# Patient Record
Sex: Female | Born: 1960
Health system: Southern US, Community
[De-identification: ages and names within clinical notes are randomized; demographics above are authoritative.]

## PROBLEM LIST (undated history)

## (undated) DIAGNOSIS — Z87442 Personal history of urinary calculi: Secondary | ICD-10-CM

## (undated) DIAGNOSIS — Z973 Presence of spectacles and contact lenses: Secondary | ICD-10-CM

## (undated) DIAGNOSIS — D649 Anemia, unspecified: Secondary | ICD-10-CM

## (undated) DIAGNOSIS — F418 Other specified anxiety disorders: Secondary | ICD-10-CM

## (undated) HISTORY — PX: TUBAL LIGATION: SHX77

## (undated) HISTORY — DX: Other specified anxiety disorders: F41.8

## (undated) HISTORY — PX: HERNIA REPAIR: SHX51

## (undated) HISTORY — DX: Anemia, unspecified: D64.9

---

## 2001-10-19 ENCOUNTER — Encounter (INDEPENDENT_AMBULATORY_CARE_PROVIDER_SITE_OTHER): Payer: Self-pay | Admitting: *Deleted

## 2001-10-19 ENCOUNTER — Ambulatory Visit (HOSPITAL_BASED_OUTPATIENT_CLINIC_OR_DEPARTMENT_OTHER): Admission: RE | Admit: 2001-10-19 | Discharge: 2001-10-19 | Payer: Self-pay | Admitting: Specialist

## 2003-03-02 ENCOUNTER — Other Ambulatory Visit: Admission: RE | Admit: 2003-03-02 | Discharge: 2003-03-02 | Payer: Self-pay | Admitting: *Deleted

## 2003-06-02 ENCOUNTER — Ambulatory Visit (HOSPITAL_COMMUNITY): Admission: RE | Admit: 2003-06-02 | Discharge: 2003-06-02 | Payer: Self-pay | Admitting: *Deleted

## 2003-06-02 ENCOUNTER — Encounter (INDEPENDENT_AMBULATORY_CARE_PROVIDER_SITE_OTHER): Payer: Self-pay

## 2004-06-29 ENCOUNTER — Ambulatory Visit: Payer: Self-pay | Admitting: Internal Medicine

## 2004-07-27 ENCOUNTER — Ambulatory Visit: Payer: Self-pay | Admitting: Internal Medicine

## 2004-11-06 ENCOUNTER — Ambulatory Visit: Payer: Self-pay | Admitting: Internal Medicine

## 2005-01-23 ENCOUNTER — Ambulatory Visit: Payer: Self-pay | Admitting: Internal Medicine

## 2006-02-10 ENCOUNTER — Ambulatory Visit: Payer: Self-pay | Admitting: Internal Medicine

## 2006-02-10 ENCOUNTER — Encounter (INDEPENDENT_AMBULATORY_CARE_PROVIDER_SITE_OTHER): Payer: Self-pay | Admitting: Specialist

## 2006-02-10 ENCOUNTER — Other Ambulatory Visit: Admission: RE | Admit: 2006-02-10 | Discharge: 2006-02-10 | Payer: Self-pay | Admitting: Internal Medicine

## 2006-08-19 DIAGNOSIS — F418 Other specified anxiety disorders: Secondary | ICD-10-CM

## 2006-08-19 HISTORY — DX: Other specified anxiety disorders: F41.8

## 2007-03-02 ENCOUNTER — Ambulatory Visit: Payer: Self-pay | Admitting: Internal Medicine

## 2007-03-02 DIAGNOSIS — F411 Generalized anxiety disorder: Secondary | ICD-10-CM | POA: Insufficient documentation

## 2007-03-02 DIAGNOSIS — D649 Anemia, unspecified: Secondary | ICD-10-CM | POA: Insufficient documentation

## 2007-03-02 DIAGNOSIS — R87619 Unspecified abnormal cytological findings in specimens from cervix uteri: Secondary | ICD-10-CM

## 2007-03-02 DIAGNOSIS — F329 Major depressive disorder, single episode, unspecified: Secondary | ICD-10-CM

## 2007-03-04 LAB — CONVERTED CEMR LAB
Basophils Relative: 1.2 % — ABNORMAL HIGH (ref 0.0–1.0)
CO2: 29 meq/L (ref 19–32)
Cholesterol: 233 mg/dL (ref 0–200)
Eosinophils Absolute: 0 10*3/uL (ref 0.0–0.6)
Eosinophils Relative: 0.9 % (ref 0.0–5.0)
GFR calc Af Amer: 100 mL/min
GFR calc non Af Amer: 82 mL/min
Glucose, Bld: 83 mg/dL (ref 70–99)
HDL: 73.1 mg/dL (ref >39.0)
Hemoglobin: 11.8 g/dL — ABNORMAL LOW (ref 12.0–15.0)
Lymphocytes Relative: 41.4 % (ref 12.0–46.0)
MCV: 91.5 fL (ref 78.0–100.0)
Monocytes Absolute: 0.5 10*3/uL (ref 0.2–0.7)
Monocytes Relative: 13.6 % — ABNORMAL HIGH (ref 3.0–11.0)
Neutro Abs: 1.6 10*3/uL (ref 1.4–7.7)
Platelets: 212 10*3/uL (ref 150–400)
Potassium: 4.1 meq/L (ref 3.5–5.1)
VLDL: 31 mg/dL (ref 0–40)
WBC: 3.8 10*3/uL — ABNORMAL LOW (ref 4.5–10.5)

## 2007-04-17 ENCOUNTER — Telehealth (INDEPENDENT_AMBULATORY_CARE_PROVIDER_SITE_OTHER): Payer: Self-pay | Admitting: *Deleted

## 2007-05-08 ENCOUNTER — Ambulatory Visit: Payer: Self-pay | Admitting: Gastroenterology

## 2007-06-16 ENCOUNTER — Telehealth: Payer: Self-pay | Admitting: Internal Medicine

## 2007-07-06 ENCOUNTER — Encounter (INDEPENDENT_AMBULATORY_CARE_PROVIDER_SITE_OTHER): Payer: Self-pay | Admitting: *Deleted

## 2007-07-07 ENCOUNTER — Ambulatory Visit: Payer: Self-pay | Admitting: Gastroenterology

## 2007-07-07 ENCOUNTER — Encounter: Payer: Self-pay | Admitting: Internal Medicine

## 2007-07-07 ENCOUNTER — Encounter: Payer: Self-pay | Admitting: Gastroenterology

## 2007-07-07 DIAGNOSIS — K257 Chronic gastric ulcer without hemorrhage or perforation: Secondary | ICD-10-CM | POA: Insufficient documentation

## 2007-08-24 ENCOUNTER — Ambulatory Visit: Payer: Self-pay | Admitting: Internal Medicine

## 2007-09-02 ENCOUNTER — Telehealth: Payer: Self-pay | Admitting: Internal Medicine

## 2007-11-23 ENCOUNTER — Telehealth (INDEPENDENT_AMBULATORY_CARE_PROVIDER_SITE_OTHER): Payer: Self-pay | Admitting: *Deleted

## 2008-02-23 ENCOUNTER — Ambulatory Visit: Payer: Self-pay | Admitting: Internal Medicine

## 2008-02-25 ENCOUNTER — Encounter (INDEPENDENT_AMBULATORY_CARE_PROVIDER_SITE_OTHER): Payer: Self-pay | Admitting: *Deleted

## 2008-02-29 ENCOUNTER — Encounter: Payer: Self-pay | Admitting: Gastroenterology

## 2008-03-01 ENCOUNTER — Encounter: Payer: Self-pay | Admitting: Internal Medicine

## 2008-03-02 ENCOUNTER — Telehealth: Payer: Self-pay | Admitting: Internal Medicine

## 2008-03-06 ENCOUNTER — Telehealth: Payer: Self-pay | Admitting: Internal Medicine

## 2008-04-05 ENCOUNTER — Ambulatory Visit: Payer: Self-pay | Admitting: Internal Medicine

## 2008-04-05 LAB — CONVERTED CEMR LAB
Inflenza A Ag: NEGATIVE
Influenza B Ag: NEGATIVE

## 2008-04-07 ENCOUNTER — Telehealth: Payer: Self-pay | Admitting: Internal Medicine

## 2008-04-14 ENCOUNTER — Encounter: Payer: Self-pay | Admitting: Internal Medicine

## 2008-05-06 ENCOUNTER — Telehealth (INDEPENDENT_AMBULATORY_CARE_PROVIDER_SITE_OTHER): Payer: Self-pay | Admitting: *Deleted

## 2008-06-23 ENCOUNTER — Ambulatory Visit: Payer: Self-pay | Admitting: Internal Medicine

## 2008-06-27 ENCOUNTER — Telehealth (INDEPENDENT_AMBULATORY_CARE_PROVIDER_SITE_OTHER): Payer: Self-pay | Admitting: *Deleted

## 2008-06-27 LAB — CONVERTED CEMR LAB
BUN: 12 mg/dL (ref 6–23)
Basophils Absolute: 0 10*3/uL (ref 0.0–0.1)
Cholesterol: 187 mg/dL (ref 0–200)
Eosinophils Absolute: 0.1 10*3/uL (ref 0.0–0.7)
Eosinophils Relative: 1.7 % (ref 0.0–5.0)
GFR calc Af Amer: 116 mL/min
GFR calc non Af Amer: 96 mL/min
HCT: 33.6 % — ABNORMAL LOW (ref 36.0–46.0)
HDL: 51 mg/dL (ref >39.0)
MCHC: 33.6 g/dL (ref 30.0–36.0)
MCV: 92.7 fL (ref 78.0–100.0)
Monocytes Absolute: 0.4 10*3/uL (ref 0.1–1.0)
Platelets: 200 10*3/uL (ref 150–400)
Potassium: 4.1 meq/L (ref 3.5–5.1)
RDW: 12.9 % (ref 11.5–14.6)
TSH: 1.07 microintl units/mL (ref 0.35–5.50)
Triglycerides: 67 mg/dL (ref 0–149)
VLDL: 13 mg/dL (ref 0–40)

## 2008-06-28 ENCOUNTER — Ambulatory Visit: Payer: Self-pay | Admitting: Internal Medicine

## 2008-07-01 ENCOUNTER — Telehealth (INDEPENDENT_AMBULATORY_CARE_PROVIDER_SITE_OTHER): Payer: Self-pay | Admitting: *Deleted

## 2008-07-01 LAB — CONVERTED CEMR LAB: Folate: 11.1 ng/mL

## 2008-07-07 ENCOUNTER — Encounter (INDEPENDENT_AMBULATORY_CARE_PROVIDER_SITE_OTHER): Payer: Self-pay | Admitting: *Deleted

## 2008-09-29 ENCOUNTER — Ambulatory Visit: Payer: Self-pay | Admitting: Internal Medicine

## 2008-09-30 ENCOUNTER — Encounter (INDEPENDENT_AMBULATORY_CARE_PROVIDER_SITE_OTHER): Payer: Self-pay | Admitting: *Deleted

## 2008-10-05 ENCOUNTER — Ambulatory Visit: Payer: Self-pay | Admitting: Gastroenterology

## 2008-10-05 DIAGNOSIS — R1310 Dysphagia, unspecified: Secondary | ICD-10-CM | POA: Insufficient documentation

## 2008-10-12 ENCOUNTER — Telehealth (INDEPENDENT_AMBULATORY_CARE_PROVIDER_SITE_OTHER): Payer: Self-pay | Admitting: *Deleted

## 2008-10-19 ENCOUNTER — Telehealth: Payer: Self-pay | Admitting: Gastroenterology

## 2008-10-20 ENCOUNTER — Ambulatory Visit: Payer: Self-pay | Admitting: Gastroenterology

## 2008-10-24 ENCOUNTER — Encounter: Payer: Self-pay | Admitting: Gastroenterology

## 2008-11-09 ENCOUNTER — Telehealth (INDEPENDENT_AMBULATORY_CARE_PROVIDER_SITE_OTHER): Payer: Self-pay | Admitting: *Deleted

## 2009-02-10 ENCOUNTER — Telehealth (INDEPENDENT_AMBULATORY_CARE_PROVIDER_SITE_OTHER): Payer: Self-pay | Admitting: *Deleted

## 2009-03-15 ENCOUNTER — Telehealth (INDEPENDENT_AMBULATORY_CARE_PROVIDER_SITE_OTHER): Payer: Self-pay | Admitting: *Deleted

## 2009-03-21 ENCOUNTER — Telehealth (INDEPENDENT_AMBULATORY_CARE_PROVIDER_SITE_OTHER): Payer: Self-pay | Admitting: *Deleted

## 2009-03-23 ENCOUNTER — Telehealth: Payer: Self-pay | Admitting: Internal Medicine

## 2009-03-28 ENCOUNTER — Ambulatory Visit: Payer: Self-pay | Admitting: Internal Medicine

## 2009-04-10 ENCOUNTER — Telehealth: Payer: Self-pay | Admitting: Internal Medicine

## 2009-05-03 ENCOUNTER — Telehealth: Payer: Self-pay | Admitting: Internal Medicine

## 2009-06-06 ENCOUNTER — Encounter (INDEPENDENT_AMBULATORY_CARE_PROVIDER_SITE_OTHER): Payer: Self-pay | Admitting: *Deleted

## 2009-06-06 ENCOUNTER — Telehealth (INDEPENDENT_AMBULATORY_CARE_PROVIDER_SITE_OTHER): Payer: Self-pay | Admitting: *Deleted

## 2009-06-15 ENCOUNTER — Ambulatory Visit (HOSPITAL_BASED_OUTPATIENT_CLINIC_OR_DEPARTMENT_OTHER): Admission: RE | Admit: 2009-06-15 | Discharge: 2009-06-15 | Payer: Self-pay | Admitting: Internal Medicine

## 2009-06-15 ENCOUNTER — Ambulatory Visit: Payer: Self-pay | Admitting: Diagnostic Radiology

## 2009-07-21 ENCOUNTER — Ambulatory Visit: Payer: Self-pay | Admitting: Family Medicine

## 2009-08-07 ENCOUNTER — Telehealth: Payer: Self-pay | Admitting: Internal Medicine

## 2009-09-08 ENCOUNTER — Telehealth: Payer: Self-pay | Admitting: Internal Medicine

## 2009-11-14 ENCOUNTER — Ambulatory Visit: Payer: Self-pay | Admitting: Internal Medicine

## 2009-11-23 LAB — CONVERTED CEMR LAB
BUN: 11 mg/dL (ref 6–23)
Basophils Absolute: 0.1 10*3/uL (ref 0.0–0.1)
Chloride: 107 meq/L (ref 96–112)
Cholesterol: 212 mg/dL — ABNORMAL HIGH (ref 0–200)
GFR calc non Af Amer: 81.29 mL/min (ref >60)
Glucose, Bld: 78 mg/dL (ref 70–99)
HCT: 38.1 % (ref 36.0–46.0)
HDL: 69.9 mg/dL (ref >39.00)
Lymphs Abs: 1.7 10*3/uL (ref 0.7–4.0)
MCV: 99.7 fL (ref 78.0–100.0)
Monocytes Absolute: 1.3 10*3/uL — ABNORMAL HIGH (ref 0.1–1.0)
Platelets: 196 10*3/uL (ref 150.0–400.0)
Potassium: 3.8 meq/L (ref 3.5–5.1)
RDW: 12.3 % (ref 11.5–14.6)
Total Bilirubin: 0.6 mg/dL (ref 0.3–1.2)
Total CHOL/HDL Ratio: 3
Triglycerides: 90 mg/dL (ref 0.0–149.0)
VLDL: 18 mg/dL (ref 0.0–40.0)

## 2009-12-05 ENCOUNTER — Telehealth (INDEPENDENT_AMBULATORY_CARE_PROVIDER_SITE_OTHER): Payer: Self-pay | Admitting: *Deleted

## 2010-01-12 ENCOUNTER — Telehealth (INDEPENDENT_AMBULATORY_CARE_PROVIDER_SITE_OTHER): Payer: Self-pay | Admitting: *Deleted

## 2010-04-04 ENCOUNTER — Telehealth: Payer: Self-pay | Admitting: Internal Medicine

## 2010-06-25 ENCOUNTER — Telehealth: Payer: Self-pay | Admitting: Internal Medicine

## 2010-06-29 ENCOUNTER — Ambulatory Visit: Payer: Self-pay | Admitting: Internal Medicine

## 2010-06-29 DIAGNOSIS — R635 Abnormal weight gain: Secondary | ICD-10-CM | POA: Insufficient documentation

## 2010-06-29 DIAGNOSIS — K3189 Other diseases of stomach and duodenum: Secondary | ICD-10-CM

## 2010-06-29 DIAGNOSIS — R1013 Epigastric pain: Secondary | ICD-10-CM

## 2010-07-04 LAB — CONVERTED CEMR LAB
AST: 20 units/L (ref 0–37)
Basophils Relative: 0.5 % (ref 0.0–3.0)
Bilirubin, Direct: 0.2 mg/dL (ref 0.0–0.3)
Eosinophils Relative: 0.7 % (ref 0.0–5.0)
HCT: 35.4 % — ABNORMAL LOW (ref 36.0–46.0)
Lymphs Abs: 1.4 10*3/uL (ref 0.7–4.0)
MCHC: 35 g/dL (ref 30.0–36.0)
MCV: 98 fL (ref 78.0–100.0)
Monocytes Absolute: 0.6 10*3/uL (ref 0.1–1.0)
RBC: 3.61 M/uL — ABNORMAL LOW (ref 3.87–5.11)
Total Bilirubin: 0.8 mg/dL (ref 0.3–1.2)
WBC: 4.8 10*3/uL (ref 4.5–10.5)

## 2010-07-10 ENCOUNTER — Encounter (INDEPENDENT_AMBULATORY_CARE_PROVIDER_SITE_OTHER): Payer: Self-pay | Admitting: *Deleted

## 2010-07-13 ENCOUNTER — Ambulatory Visit: Payer: Self-pay | Admitting: Internal Medicine

## 2010-08-28 ENCOUNTER — Telehealth: Payer: Self-pay | Admitting: Internal Medicine

## 2010-08-29 ENCOUNTER — Ambulatory Visit
Admission: RE | Admit: 2010-08-29 | Discharge: 2010-08-29 | Payer: Self-pay | Source: Home / Self Care | Attending: Internal Medicine | Admitting: Internal Medicine

## 2010-09-05 ENCOUNTER — Telehealth: Payer: Self-pay | Admitting: Internal Medicine

## 2010-09-08 ENCOUNTER — Encounter: Payer: Self-pay | Admitting: Internal Medicine

## 2010-09-09 ENCOUNTER — Encounter: Payer: Self-pay | Admitting: Internal Medicine

## 2010-09-18 NOTE — Progress Notes (Signed)
Summary: REFILL REQUEST  Phone Note Refill Request Call back at [phone] Message from:  Pharmacy on April 04, 2010 9:33 AM  Refills Requested: Medication #1:  ALPRAZOLAM 0.5 MG TABS 3 by mouth qhs   Dosage confirmed as above?Dosage Confirmed   Supply Requested: 1 month   Last Refilled: 03/09/2010 [facility] [address]  Next Appointment Scheduled: NONE Initial call taken by: Lavell Islam,  April 04, 2010 9:33 AM  Follow-up for Phone Call        ok 90 and 6 RF  Follow-up by: [facility] E. Bayle Calvo MD,  April 04, 2010 4:51 PM    Prescriptions: ALPRAZOLAM 0.5 MG TABS (ALPRAZOLAM) 3 by mouth qhs  #90 x 6   Entered by:   Army Fossa CMA   Authorized by:   Nolon Rod. Janetta Vandoren MD   Signed by:   Army Fossa CMA on 04/04/2010   Method used:   Printed then faxed to ...       [facility] [address] (retail)       [address]       [place], [place]  [address]       Ph: [phone] or [phone]       Fax: [phone]   RxID:   [id number]

## 2010-09-18 NOTE — Assessment & Plan Note (Signed)
Summary: Discuss abd being swollen/drb   Vital Signs:  Patient profile:   50 year old female Height:      66 inches (167.64 cm) Weight:      161.50 pounds (73.41 kg) BMI:     26.16 Temp:     97.8 degrees F (36.56 degrees C) oral BP sitting:   140 / 80  (left arm) Cuff size:   regular  Vitals Entered By: Lucious Groves CMA (June 29, 2010 1:02 PM) CC: Discuss abd being swollen on/off x1 week./kb Is Patient Diabetic? No Pain Assessment Patient in pain? yes     Location: abdomen Intensity: 6 Type: tightness Onset of pain  one week   History of Present Illness: several concerns Feels like her whole abdomen is bloated on an off, symptoms not necessarily related to food. Has a lot of gas. Her weight varies  several pounds up and dowwn even from one week to the other Had a   lipid panel checked and triglycerides were very high  Review of systems she had nausea and diarrhea one time in the last few months No blood in the stools No GERD symptoms Bowel movements are essentially regular, she has occasional constipation which is not uncommon for her Her diet is healthy She just started to exercise some  anxiety and depression  well-controlled   Current Medications (verified): 1)  Alprazolam 0.5 Mg Tabs (Alprazolam) .... 3 By Mouth Qhs 2)  Acyclovir 800 Mg  Tabs (Acyclovir) .... Three Times A Day As Needed Fever Blisters 3)  Nasonex 50 Mcg/act Susp (Mometasone Furoate) .... 2 Sprays Each Nostril Once Daily 4)  Citalopram Hydrobromide 20 Mg Tabs (Citalopram Hydrobromide) .... Take Half Tablet A Day For One Week, Then Take One Tablet Daily. 5)  Pantoprazole Sodium 40 Mg Tbec (Pantoprazole Sodium) .... Take One Tablet Two Times A Day 30 Minutes Before Meal.  Allergies (verified): No Known Drug Allergies  Past History:  Past Medical History: Reviewed history from 11/02/2007 and no changes required. G2 P2 Anxiety Depression Anemia-NOS gastric ulcer  Past Surgical  History: Reviewed history from 11/02/2007 and no changes required. Tubal ligation hernia surgery  Social History: Married husband dx w/ colon ca (Dx 2008), cancer recurrence aprox 12-2008, doing better   two children one w/ special needs (27 y/o) [employer] co. Patient has never smoked.  Alcohol Use - yes  occasionally Illicit Drug Use - no exercise-- x3/week diet--  healthy   Physical Exam  General:  alert and well-developed.  has gained 10 pounds in 6 months Neck:  no thyromegaly Lungs:  normal respiratory effort, no intercostal retractions, no accessory muscle use, and normal breath sounds.   Heart:  normal rate, regular rhythm, no murmur, and no gallop.   Abdomen:  soft, no distention, no masses, no guarding, and no rigidity.  slightly tender at the epigastric area. Good bowel sounds Extremities:  normal extremity edema Psych:  Oriented X3, memory intact for recent and remote, normally interactive, good eye contact, not anxious appearing, and not depressed appearing.     Impression & Recommendations:  Problem # 1:  DYSPEPSIA (ICD-536.8) Assessment New patient here complaining of feeling bloated, lots of gas. Chart reviewed gastric ulcer by EGD 2008 Normal colonoscopy in 2010 plan PPIs Labs Reassess in 1 months iFOB Ultrasound of the abdomen and pelvis  (like to check her ovaries d/t bloated feelinf not neccesarily related to food)  Orders: Venipuncture (16109) TLB-Hepatic/Liver Function Pnl (80076-HEPATIC) TLB-CBC Platelet - w/Differential (85025-CBCD)  TLB-Amylase (82150-AMYL) TLB-Lipase (83690-LIPASE) Specimen Handling (13086) Radiology Referral (Radiology)  Problem # 2:  WEIGHT GAIN (ICD-783.1) Assessment: New reports her wt varies up and down. Compared to the last timeshe came, she has gained several pounds  diet and exercise dicussed  Triglycerides reportedly elevated. Will get report  Orders: TLB-TSH (Thyroid Stimulating  Hormone) (84443-TSH)  Complete Medication List: 1)  Alprazolam 0.5 Mg Tabs (Alprazolam) .... 3 by mouth qhs 2)  Acyclovir 800 Mg Tabs (Acyclovir) .... Three times a day as needed fever blisters 3)  Nasonex 50 Mcg/act Susp (Mometasone furoate) .... 2 sprays each nostril once daily 4)  Citalopram Hydrobromide 20 Mg Tabs (Citalopram hydrobromide) .... Take half tablet a day for one week, then take one tablet daily. 5)  Pantoprazole Sodium 40 Mg Tbec (Pantoprazole sodium) .... Take one tablet two times a day 30 minutes before meal.  Patient Instructions: 1)  low-fat diet 2)  Exercise daily 3)  Continue Protonix 4)  Align one daily 5)  send back  the IFOB 6)  Please schedule a follow-up appointment in 1 month.    Orders Added: 1)  Venipuncture [36415] 2)  TLB-Hepatic/Liver Function Pnl [80076-HEPATIC] 3)  TLB-CBC Platelet - w/Differential [85025-CBCD] 4)  TLB-TSH (Thyroid Stimulating Hormone) [84443-TSH] 5)  TLB-Amylase [82150-AMYL] 6)  TLB-Lipase [83690-LIPASE] 7)  Specimen Handling [99000] 8)  Radiology Referral [Radiology] 9)  Est. Patient Level IV [57846]

## 2010-09-18 NOTE — Progress Notes (Signed)
Summary: restart  citalopram  Phone Note Outgoing Call Call back at [facility] Phone [phone] Call back at Work Phone [phone]   Summary of Call:  - Pharmacy request for sertraline 50mg  1 by mouth once daily x 10 then increase to 2 tablet - [facility]  - med has been removed from list  - left message on machine for pt to return call .....Marland KitchenMarland KitchenShary Decamp  Jan 12, 2010 4:39 PM left message on machine for pt to return call ........Marland KitchenShary Decamp  Jan 16, 2010 12:45 PM  left message to call office..............Marland KitchenFelecia Deloach CMA  January 17, 2010 9:01 AM  left message to call office.Marland KitchenMarland KitchenMarland KitchenFelecia Deloach CMA  January 17, 2010 4:43 PM   Follow-up for Phone Call        Pt states that she has been off this med for a about  3 months but has notice that symptoms have return. pt would like to restart med at a lower dose to see if this will help. pt last seen 11-14-09 CPX. Ok to refill med or does pt need to come in for OV.. pls advise..........Marland KitchenFelecia Deloach CMA  January 18, 2010 8:37 AM   Additional Follow-up for Phone Call Additional follow up Details #1::        she was actually taking citalopram 40 mg daily. Plan: Citalopram 20 mg half tablet a day for one week, then take one tablet daily. If she is doing okay with 20 mg then stay on that dose. Otherwise we can go back to 40 mg as before Additional Follow-up by: Jose E. Paz MD,  January 18, 2010 10:22 AM    Additional Follow-up for Phone Call Additional follow up Details #2::    left pt detail message as requested by pt. Advise pt rx sent in and to return call with any concern...............Marland KitchenFelecia Deloach CMA  January 18, 2010 10:30 AM   New/Updated Medications: CITALOPRAM HYDROBROMIDE 20 MG TABS (CITALOPRAM HYDROBROMIDE) Take half tablet a day for one week, then take one tablet daily. Prescriptions: CITALOPRAM HYDROBROMIDE 20 MG TABS (CITALOPRAM HYDROBROMIDE) Take half tablet a day for one week, then take one tablet daily.  #30 x 2  Entered by:   Jeremy Johann CMA   Authorized by:   Nolon Rod. Paz MD   Signed by:   Jeremy Johann CMA on 01/18/2010   Method used:   Faxed to ...       [facility] [address] (retail)       [address]       [place], [place]  [address]       Ph: [phone] or [phone]       Fax: [phone]   RxID:   [id number]

## 2010-09-18 NOTE — Progress Notes (Signed)
Summary: refill   Phone Note Refill Request Message from:  Fax from Pharmacy on June 25, 2010 9:47 AM  Refills Requested: Medication #1:  PANTOPRAZOLE 40 MG TAB TALE ONE TABLET TWICE DAILY 30 MINUTES BEFORE A MEAL [facility] - fax [phone]  Initial call taken by: Okey Regal Spring,  June 25, 2010 9:49 AM  Follow-up for Phone Call        Pt No showed for appt, okay to fill?  Follow-up by: Army Fossa CMA,  June 25, 2010 10:22 AM  Additional Follow-up for Phone Call Additional follow up Details #1::        her next regular appointment is  10-2010 although pantoprazole  is no on her current medication list she used to take it at some point. Okay to call pantoprazole  #60, one refill.  Advise patient that if she has ongoing GI symptoms needs a  office visit Additional Follow-up by: Ralphie Lovelady E. Hibo Blasdell MD,  June 25, 2010 1:37 PM    Additional Follow-up for Phone Call Additional follow up Details #2::    Left message for pt to call back. Army Fossa CMA  June 25, 2010 1:42 PM   left message for pt to call back. Army Fossa CMA  June 26, 2010 10:37 AM   Additional Follow-up for Phone Call Additional follow up Details #3:: Details for Additional Follow-up Action Taken: I spoke with pt and she states that her abdomen area stays swollen/bloated, scheduled appt for her on Friday @ 11:40. Additional Follow-up by: Army Fossa CMA,  June 27, 2010 10:49 AM  New/Updated Medications: PANTOPRAZOLE SODIUM 40 MG TBEC (PANTOPRAZOLE SODIUM) Take one tablet two times a day 30 minutes before meal. Prescriptions: PANTOPRAZOLE SODIUM 40 MG TBEC (PANTOPRAZOLE SODIUM) Take one tablet two times a day 30 minutes before meal.  #60 x 1   Entered by:   Army Fossa CMA   Authorized by:   Nolon Rod. Ithan Touhey MD   Signed by:   Army Fossa CMA on 06/25/2010   Method used:   Electronically to        [facility] (retail)       [address]       [place], [place]  [address]       Ph: [phone] or [phone]       Fax: [phone]   RxID:   [id number]

## 2010-09-18 NOTE — Assessment & Plan Note (Signed)
Summary: CPX/CDJ   Vital Signs:  Patient profile:   50 year old female Height:      66 inches Weight:      151 pounds BMI:     24.46 Pulse rate:   60 / minute BP sitting:   120 / 70  Vitals Entered By: Shary Decamp (November 14, 2009 2:03 PM)  History of Present Illness: CPX  Preventive Screening-Counseling & Management  Caffeine-Diet-Exercise     Does Patient Exercise: yes     Times/week: 3  Allergies: No Known Drug Allergies  Past History:  Past Medical History: Reviewed history from 11/02/2007 and no changes required. G2 P2 Anxiety Depression Anemia-NOS gastric ulcer  Past Surgical History: Reviewed history from 11/02/2007 and no changes required. Tubal ligation hernia surgery  Family History: Reviewed history from 08/24/2007 and no changes required. no history of breast or colon cancer. father has a history of esophageal cancer per pt DM-no MI-- no  Social History: Married husband dx w/ colon ca (Dx 2008), cancer recurrence aprox 12-2008, doing better as of 3-11 two children one w/ special needs (55 y/o) [employer] co. Patient has never smoked.  Alcohol Use - yes  occasionally Illicit Drug Use - no exercise-- x3/week diet--  healthy  Does Patient Exercise:  yes  Review of Systems ENT:  allergies well controlled with p.r.n. nasal sprays. CV:  Denies chest pain or discomfort and swelling of feet. Resp:  Denies cough and shortness of breath. GI:  Denies bloody stools, diarrhea, nausea, and vomiting; no heartburn . Psych:  husband (has cancer) doingbetter, patient self wean-off celexa still on xanax, works well to help her sleep. '  Physical Exam  General:  alert, well-developed, and well-nourished.   Neck:  no masses, no thyromegaly, and normal carotid upstroke.   Lungs:  normal respiratory effort, no intercostal retractions, no accessory muscle use, and normal breath sounds.   Heart:  normal rate, regular rhythm, no  murmur, and no gallop.   Abdomen:  soft, non-tender, no distention, and no masses.   Extremities:  no edema Psych:  Oriented X3, memory intact for recent and remote, normally interactive, good eye contact, not anxious appearing, and not depressed appearing.     Impression & Recommendations:  Problem # 1:  HEALTH MAINTENANCE EXAM (ICD-V70.0) Td 2008 female care per gynecology. Sees them routinely  MMG yearly per patient  cont healthy life style  doing very well   Orders: Venipuncture (16109) TLB-BMP (Basic Metabolic Panel-BMET) (80048-METABOL) TLB-CBC Platelet - w/Differential (85025-CBCD) TLB-Hepatic/Liver Function Pnl (80076-HEPATIC) TLB-Lipid Panel (80061-LIPID) TLB-TSH (Thyroid Stimulating Hormone) (84443-TSH)  Problem # 2:  ANXIETY (ICD-300.00) much improvement her husband was diagnosed with colon cancer 3 years ago, he is doing better, patient self discontinue citalopram The following medications were removed from the medication list:    Citalopram Hydrobromide 20 Mg Tabs (Citalopram hydrobromide) .Marland Kitchen... 2 daily Her updated medication list for this problem includes:    Alprazolam 0.5 Mg Tabs (Alprazolam) .Marland KitchenMarland KitchenMarland KitchenMarland Kitchen 3 by mouth qhs  Complete Medication List: 1)  Alprazolam 0.5 Mg Tabs (Alprazolam) .... 3 by mouth qhs 2)  Acyclovir 800 Mg Tabs (Acyclovir) .... Three times a day as needed fever blisters 3)  Nasonex 50 Mcg/act Susp (Mometasone furoate) .... 2 sprays each nostril once daily 4)  Astepro 0.15 % Soln (Azelastine hcl) .... 2 sprays each nostril once daily  Patient Instructions: 1)  Please schedule a follow-up appointment in 1 year.    Preventive Care Screening  Prior Values:  Mammogram:  ASSESSMENT: Negative - BI-RADS 1^MM DIGITAL SCREENING (06/15/2009)    Colonoscopy:  Location:  [facility].   (10/20/2008)    Last Tetanus Booster:  Tdap (03/02/2007)    Risk Factors:  Tobacco use:  never Drug use:  no Alcohol use:  yes    Comments:   socially Exercise:  yes    Times per week:  3  Colonoscopy History:    Date of Last Colonoscopy:  10/20/2008  Mammogram History:    Date of Last Mammogram:  06/15/2009  @  [facility]

## 2010-09-18 NOTE — Letter (Signed)
Summary: [facility]: Immunoassay Fecal Occult Blood (iFOB) Order Form  [facility] [place]  [address] [place], [place] [address]   Phone: [phone]  Fax: [phone]      [facility]: Immunoassay Fecal Occult Blood (iFOB) Order Form   July 10, 2010 MRN: [id number]   [facility] [birth year]   Physicican Name:_____jose paz,md____________________  Diagnosis Code:______285.9____________________      Army Fossa CMA

## 2010-09-18 NOTE — Progress Notes (Signed)
Summary: Refill Request Alprazolam  Phone Note Refill Request Message from:  Pharmacy  Refills Requested: Medication #1:  ALPRAZOLAM 0.5 MG TABS 3 by mouth qhs   Dosage confirmed as above?Dosage Confirmed   Supply Requested: 1 month   Last Refilled: 11/05/2009 Initial call taken by: Harold Barban,  December 05, 2009 8:31 AM  Follow-up for Phone Call        last ov 11/14/09, last refill #90 x 2 on 09/08/09 .Kandice Hams  December 05, 2009 9:14 AM   Additional Follow-up for Phone Call Additional follow up Details #1::        90 and 3 RF Jose E. Paz MD  December 05, 2009 12:56 PM     Prescriptions: ALPRAZOLAM 0.5 MG TABS (ALPRAZOLAM) 3 by mouth qhs  #90 x 3   Entered by:   Kandice Hams   Authorized by:   Nolon Rod. Paz MD   Signed by:   Kandice Hams on 12/05/2009   Method used:   Reprint   RxID:   [id number] ALPRAZOLAM 0.5 MG TABS (ALPRAZOLAM) 3 by mouth qhs  #90 x 3   Entered by:   Kandice Hams   Authorized by:   Nolon Rod. Paz MD   Signed by:   Kandice Hams on 12/05/2009   Method used:   Printed then faxed to ...       [facility] [address] (retail)       [address]       [place], [place]  [address]       Ph: [phone] or [phone]       Fax: [phone]   RxID:   [id number]

## 2010-09-18 NOTE — Progress Notes (Signed)
Summary: refill  Phone Note Refill Request Message from:  Fax from Pharmacy on [address] fax [phone]  Refills Requested: Medication #1:  ALPRAZOLAM 0.5 MG TABS 3 by mouth qhs Initial call taken by: Barb Merino,  September 08, 2009 8:27 AM  Follow-up for Phone Call        rx'd #90 08/07/2009 Follow-up by: Shary Decamp,  September 08, 2009 12:31 PM  Additional Follow-up for Phone Call Additional follow up Details #1::        ok 90 and 2 RF  Additional Follow-up by: [facility] E. Dalis Beers MD,  September 08, 2009 1:24 PM     Appended Document: refill Prescriptions: ALPRAZOLAM 0.5 MG TABS (ALPRAZOLAM) 3 by mouth qhs  #90 x 2   Entered by:   Shary Decamp   Authorized by:   Nolon Rod. Eaton Folmar MD   Signed by:   Shary Decamp on 09/08/2009   Method used:   Printed then faxed to ...       [facility] [address] (retail)       [address]       [place], [place]  [address]       Ph: [phone] or [phone]       Fax: [phone]   RxID:   [id number]

## 2010-09-19 ENCOUNTER — Ambulatory Visit: Payer: Self-pay | Admitting: Internal Medicine

## 2010-09-19 ENCOUNTER — Ambulatory Visit: Admit: 2010-09-19 | Payer: Self-pay | Admitting: Internal Medicine

## 2010-09-19 ENCOUNTER — Encounter: Payer: Self-pay | Admitting: Obstetrics and Gynecology

## 2010-09-20 NOTE — Assessment & Plan Note (Signed)
Summary: DEPRESSION AND ANXIETY/KB   Vital Signs:  Patient profile:   50 year old female Weight:      155.50 pounds Pulse rate:   88 / minute Pulse rhythm:   regular BP sitting:   134 / 80  (left arm) Cuff size:   regular  Vitals Entered By: Army Fossa CMA (August 29, 2010 11:54 AM) CC: Pt here to discuss anxiety Comments xanax makes her very sleepy [facility]    History of Present Illness: here with her husband Anxiety was relatively well controlled up until  3 weeks ago when the symptoms started to increase. The patient thinks that the triggers are her husband's health, work; also worries about her  disabled son. Fortunately, her husband is supportive and he is here with her  Review of systems She is mildly depressed but her main concern is anxiety no suicidal ideas   Current Medications (verified): 1)  Alprazolam 0.5 Mg Tabs (Alprazolam) .... 3 By Mouth Qhs 2)  Acyclovir 800 Mg  Tabs (Acyclovir) .... Three Times A Day As Needed Fever Blisters 3)  Citalopram Hydrobromide 20 Mg Tabs (Citalopram Hydrobromide) .... Take Half Tablet A Day For One Week, Then Take One Tablet Daily. 4)  Pantoprazole Sodium 40 Mg Tbec (Pantoprazole Sodium) .... Take One Tablet Two Times A Day 30 Minutes Before Meal.  Allergies (verified): No Known Drug Allergies  Past History:  Past Medical History: G2 P2 Anxiety, Depression-symptoms started  ~ 2008 when husband was dx w/ colon ca Anemia-NOS gastric ulcer  Past Surgical History: Reviewed history from 11/02/2007 and no changes required. Tubal ligation hernia surgery  Physical Exam  General:  alert and well-developed.   Psych:  tearful  but coherent and cooperative   Impression & Recommendations:  Problem # 1:  ANXIETY (ICD-300.00) severe anxiety for the last few weeks,  for the last 2 days sx are severe and she is unable to function. In the past she tried Zoloft and fluoxetine but Celexa was the one that ended up  helping the best  She sleeps well as long as she takes Xanax. Plan: Switch citalopram to Cymbalta add clonazepam for daytime use as needed She may need to see a psychiatrist, will initiate a referral today but  cancel if  if she doesn't need it definitely needs to see a counselor, previously the barrier was her schedule. She will make an effort to go for counseling Hopefully will need  clonazepam and Xanax in combination for short-term.  The following medications were removed from the medication list:    Citalopram Hydrobromide 20 Mg Tabs (Citalopram hydrobromide) .Marland Kitchen... Take half tablet a day for one week, then take one tablet daily. Her updated medication list for this problem includes:    Alprazolam 0.5 Mg Tabs (Alprazolam) .Marland KitchenMarland KitchenMarland KitchenMarland Kitchen 3 by mouth qhs    Cymbalta 60 Mg Cpep (Duloxetine hcl) ..... One by mouth daily    Klonopin 0.5 Mg Tabs (Clonazepam) ..... Half or one tablet twice a day as needed  Orders: Misc. Referral (Misc. Ref)  Problem # 2:  F2F 25 min , > 50% counseling   Complete Medication List: 1)  Alprazolam 0.5 Mg Tabs (Alprazolam) .... 3 by mouth qhs 2)  Acyclovir 800 Mg Tabs (Acyclovir) .... Three times a day as needed fever blisters 3)  Pantoprazole Sodium 40 Mg Tbec (Pantoprazole sodium) .... Take one tablet two times a day 30 minutes before meal. 4)  Cymbalta 60 Mg Cpep (Duloxetine hcl) .... One by mouth  daily 5)  Klonopin 0.5 Mg Tabs (Clonazepam) .... Half or one tablet twice a day as needed  Patient Instructions: 1)  continue with alprazolam at bedtime 2)  Take clonazepam during the daytime as needed for anxiety twice a day. May cause drowsiness 3)  for one week, take half citalopram and Cymbalta 30 mg one tablet daily 4)  After that, discontinue citalopram and takes Cymbalta 60 mg daily. 5)  Please schedule a follow-up appointment in 3 weeks.  Prescriptions: CYMBALTA 60 MG CPEP (DULOXETINE HCL) one by mouth daily  #30 x 1   Entered and Authorized by:   Elita Quick E. Rakeya Glab  MD   Signed by:   Nolon Rod. Exie Chrismer MD on 08/29/2010   Method used:   Print then Give to Patient   RxID:   [id number] KLONOPIN 0.5 MG TABS (CLONAZEPAM) half or one tablet twice a day as needed  #40 x 0   Entered and Authorized by:   Elita Quick E. Uthman Mroczkowski MD   Signed by:   Nolon Rod. Yenifer Saccente MD on 08/29/2010   Method used:   Print then Give to Patient   RxID:   [id number]    Orders Added: 1)  Misc. Referral [Misc. Ref] 2)  Est. Patient Level V [401[address]]

## 2010-09-20 NOTE — Progress Notes (Signed)
Summary: Depression and anxiety  Phone Note Call from Patient   Summary of Call: Patient called c/o depression and anxiety attack. She notes that she is nervous, can't sleep, and feels like pins are sticking all in her body. Patient was notified that MD does not have any openings today, so if it is so severe that she would needs to be seen today she must go to ER or UC. Patient declined and scheduled appt for tomorrow. Initial call taken by: Lucious Groves CMA,  August 28, 2010 9:17 AM

## 2010-09-20 NOTE — Progress Notes (Signed)
Summary: wants to see if Dr Drue Novel will call in something for her   Phone Note Call from Patient Call back at Work Phone [phone] Call back at [phone]   Caller: Patient Summary of Call: patient called back to see if she could be seen at our "satellite office" instead of waiting until tomorrow at 11:20--advised her that Berneta Sages was not available--looked through our doctors to see if anyone had a cancelled slot--no one had one  not out of her Xanax or Celexa---wants to know if doctor can call in a prescription for some pills to help with anxiety (this feeling is new to her)---told her that Dr Drue Novel wont call in a prescription without seeing her for a "new symptom", but she insisted that I send this note to ask him "since Dr Drue Novel knows her so well"      ---uses [facility] [address] Initial call taken by: Jerolyn Shin,  August 28, 2010 11:29 AM  Follow-up for Phone Call        please call the patient: If anxiety is the issue, she could take Xanax twice a day (in addition to the 3 that she takes at night) at least until she sees me and we figure out what the problem is and what we need to do Follow-up by: Khloee Garza E. Buffy Ehler MD,  August 28, 2010 4:46 PM  Additional Follow-up for Phone Call Additional follow up Details #1::        Left message on voicemail notifying patient. Lucious Groves CMA  August 28, 2010 4:51 PM

## 2010-09-20 NOTE — Progress Notes (Signed)
----   Converted from flag ---- ---- 08/31/2010 3:30 PM, Army Fossa CMA wrote: left message for pt to call back.   ---- 08/30/2010 11:41 AM, Army Fossa CMA wrote: left message for pt to call back.   ---- 08/29/2010 6:49 PM, Lindy Pennisi E. Syann Cupples MD wrote: please check on the patient...improving? ------------------------------  pt never returned call. Army Fossa CMA  September 05, 2010 10:21 AM

## 2010-10-05 ENCOUNTER — Encounter: Payer: Self-pay | Admitting: Internal Medicine

## 2010-10-05 ENCOUNTER — Ambulatory Visit (INDEPENDENT_AMBULATORY_CARE_PROVIDER_SITE_OTHER): Payer: No Typology Code available for payment source | Admitting: Internal Medicine

## 2010-10-05 DIAGNOSIS — F411 Generalized anxiety disorder: Secondary | ICD-10-CM

## 2010-10-10 NOTE — Assessment & Plan Note (Signed)
Summary: ? REACTION TO CYMBALTA/RH.....   Vital Signs:  Patient profile:   50 year old female Weight:      155.38 pounds Temp:     98.0 degrees F oral Pulse rate:   84 / minute Pulse rhythm:   regular BP sitting:   122 / 84  (left arm) Cuff size:   regular  Vitals Entered By: Army Fossa CMA (October 05, 2010 11:29 AM) CC: Pt here to discuss cymbalta Comments x 1 week having body aches  constipation trouble swallowing [facility]    History of Present Illness: followup from previous visit, she was quite anxious. We switched citalopram to Cymbalta, she followup the plan as prescribed. When she was taking  Cymbalta 60 mg she developed a number of symptoms: Severe constipation, dry mouth, difficulty swallowing, aches moderate to severe all over. She discontinued Cymbalta and is now back on citalopram 20 mg. the symptoms are only slightly better  ROS Emotionally she is stable, Xanax helping  her to sleep, Clonopin prevent the panic attacks during the day. Unfortunately, her husband was found to have widely metastatic colon cancer and his prognosis is not good. They gave him one year. she denies actual fever, sore throat or runny nose with the  "flu-type of symptoms"  Current Medications (verified): 1)  Alprazolam 0.5 Mg Tabs (Alprazolam) .... 3 By Mouth Qhs 2)  Acyclovir 800 Mg  Tabs (Acyclovir) .... Three Times A Day As Needed Fever Blisters 3)  Pantoprazole Sodium 40 Mg Tbec (Pantoprazole Sodium) .... Take One Tablet Two Times A Day 30 Minutes Before Meal. 4)  Klonopin 0.5 Mg Tabs (Clonazepam) .... Half or One Tablet Twice A Day As Needed  Allergies (verified): No Known Drug Allergies  Past History:  Past Medical History: Reviewed history from 08/29/2010 and no changes required. G2 P2 Anxiety, Depression-symptoms started  ~ 2008 when husband was dx w/ colon ca Anemia-NOS gastric ulcer  Past Surgical History: Reviewed history from 11/02/2007 and no  changes required. 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  Alcohol Use - yes  occasionally Illicit Drug Use - no exercise-- x3/week diet--  healthy   Physical Exam  General:  alert and well-developed.   Msk:  not  tender to palpation at the muscles of the shoulders and legs Psych:  seems better, more calm, still tearful at times during the interview     Impression & Recommendations:  Problem # 1:  ANXIETY (ICD-300.00) compared to the last visit,she seems to be doing better despite the bad news in reference to her husbands health. She has not seen a psychiatrist or a Veterinary surgeon. She plans to see her own counselor ( a friend of hers) We agreed to continue with clonazepam and Xanax as prescribed She is back on citalopram and will increase from 20 to 30 mg she does like to see a psychiatrist, a referral will be done. she also requests something for pain, I agreed to prescribe Vicodin . If the flulike symptoms, constipation and dry mouth (pressumed to be s/e from cymbalta)  are not better  better, she is to come back for reassessment.  The following medications were removed from the medication list:    Cymbalta 60 Mg Cpep (Duloxetine hcl) ..... One by mouth daily Her updated medication list  for this problem includes:    Alprazolam 0.5 Mg Tabs (Alprazolam) .Marland KitchenMarland KitchenMarland KitchenMarland Kitchen 3 by mouth qhs    Klonopin 0.5 Mg Tabs (Clonazepam) ..... Half or one tablet twice a day as needed    Citalopram Hydrobromide 20 Mg Tabs (Citalopram hydrobromide) .Marland Kitchen... 1.5 tablets a day  Orders: Psychiatric Referral (Psych)  Complete Medication List: 1)  Alprazolam 0.5 Mg Tabs (Alprazolam) .... 3 by mouth qhs 2)  Acyclovir 800 Mg Tabs (Acyclovir) .... Three times a day as needed fever blisters 3)   Pantoprazole Sodium 40 Mg Tbec (Pantoprazole sodium) .... Take one tablet two times a day 30 minutes before meal. 4)  Klonopin 0.5 Mg Tabs (Clonazepam) .... Half or one tablet twice a day as needed 5)  Citalopram Hydrobromide 20 Mg Tabs (Citalopram hydrobromide) .... 1.5 tablets a day 6)  Vicodin 5-500 Mg Tabs (Hydrocodone-acetaminophen) .Marland Kitchen.. 1 every 4 hours as needed for pain  Patient Instructions: 1)  Please schedule a follow-up appointment in 4 months .  2)    Prescriptions: VICODIN 5-500 MG TABS (HYDROCODONE-ACETAMINOPHEN) 1 every 4 hours as needed for pain  #30 x 0   Entered and Authorized by:   Nolon Rod. Paz MD   Signed by:   Nolon Rod. Paz MD on 10/05/2010   Method used:   Print then Give to Patient   RxID:   [id number] KLONOPIN 0.5 MG TABS (CLONAZEPAM) half or one tablet twice a day as needed  #60 x 0   Entered and Authorized by:   Elita Quick E. Paz MD   Signed by:   Nolon Rod. Paz MD on 10/05/2010   Method used:   Print then Give to Patient   RxID:   [id number]    Orders Added: 1)  Psychiatric Referral [Psych] 2)  Est. Patient Level III [95284]

## 2010-10-12 ENCOUNTER — Telehealth: Payer: Self-pay | Admitting: Internal Medicine

## 2010-10-16 NOTE — Progress Notes (Signed)
Summary: Duwayne Heck f/u on referal  Phone Note Outgoing Call   Summary of Call: Danielle:  please followup on the  psychiatry referral.  Did she get an appointment?  that she needs help to get an appointment? Aron Needles E. Tejay Hubert MD  October 12, 2010 4:39 PM   Follow-up for Phone Call        I spoke w/ pt she has an appt on October 22, 2010. Army Fossa CMA  October 12, 2010 4:43 PM

## 2010-10-19 ENCOUNTER — Other Ambulatory Visit (HOSPITAL_BASED_OUTPATIENT_CLINIC_OR_DEPARTMENT_OTHER): Payer: Self-pay | Admitting: Obstetrics and Gynecology

## 2010-10-19 DIAGNOSIS — Z139 Encounter for screening, unspecified: Secondary | ICD-10-CM

## 2010-10-22 ENCOUNTER — Ambulatory Visit (HOSPITAL_BASED_OUTPATIENT_CLINIC_OR_DEPARTMENT_OTHER)
Admission: RE | Admit: 2010-10-22 | Discharge: 2010-10-22 | Disposition: A | Discharge status: Home / Self Care | Payer: No Typology Code available for payment source | Source: Ambulatory Visit | Attending: Obstetrics and Gynecology | Admitting: Obstetrics and Gynecology

## 2010-10-22 ENCOUNTER — Other Ambulatory Visit: Payer: Self-pay | Admitting: Obstetrics

## 2010-10-22 ENCOUNTER — Encounter: Payer: Self-pay | Admitting: Internal Medicine

## 2010-10-22 DIAGNOSIS — Z1231 Encounter for screening mammogram for malignant neoplasm of breast: Secondary | ICD-10-CM | POA: Insufficient documentation

## 2010-10-22 DIAGNOSIS — Z139 Encounter for screening, unspecified: Secondary | ICD-10-CM

## 2010-11-06 NOTE — Letter (Signed)
Summary: [facility]  [facility]   Imported By: Maryln Gottron 10/26/2010 14:11:50  _____________________________________________________________________  External Attachment:    Type:   Image     Comment:   External Document

## 2010-11-18 ENCOUNTER — Other Ambulatory Visit: Payer: Self-pay | Admitting: Internal Medicine

## 2010-12-21 ENCOUNTER — Other Ambulatory Visit: Payer: Self-pay | Admitting: Internal Medicine

## 2010-12-24 ENCOUNTER — Telehealth: Payer: Self-pay | Admitting: Internal Medicine

## 2010-12-24 NOTE — Telephone Encounter (Signed)
Needs refill for Xanax called into [facility], [address], [place]---she is out of meds

## 2010-12-24 NOTE — Telephone Encounter (Signed)
Ok 90, 2 RF 

## 2010-12-25 ENCOUNTER — Other Ambulatory Visit: Payer: Self-pay | Admitting: *Deleted

## 2010-12-25 MED ORDER — CLONAZEPAM 0.5 MG PO TABS: ORAL_TABLET | ORAL | Status: DC

## 2010-12-25 NOTE — Telephone Encounter (Signed)
I left detailed message that rx was at pharmacy.

## 2010-12-25 NOTE — Telephone Encounter (Signed)
60, no RF 

## 2010-12-27 ENCOUNTER — Other Ambulatory Visit: Payer: Self-pay | Admitting: *Deleted

## 2010-12-27 NOTE — Telephone Encounter (Signed)
We did  60 the few days ago. Denied, must be a duplicate

## 2011-01-01 NOTE — Letter (Signed)
May 08, 2007    Ms. Brooke Phillips   RE:  [name], [name]  MRN:  [id number]  /  DOB:  Apr 25, 1961   Dear [name]:   It is my pleasure to have treated you recently as a new patient in my  office.  I appreciate your confidence and the opportunity to participate  in your care.   Since I do have a busy inpatient endoscopy schedule and office schedule,  my office hours vary weekly.  I am, however, available for emergency  calls every day through my office.  If I cannot promptly meet an urgent  office appointment, another one of our gastroenterologists will be able  to assist you.   My well-trained staff are prepared to help you at all times.  For  emergencies after office hours, a physician from our gastroenterology  section is always available through my 24-hour answering service.   While you are under my care, I encourage discussion of your questions  and concerns, and I will be happy to return your calls as soon as I am  available.   Once again, I welcome you as a new patient and I look forward to a happy  and healthy relationship.    Sincerely,      Barbette Hair. Arlyce Dice, MD,FACG  Electronically Signed   RDK/MedQ  DD: 05/08/2007  DT: 05/08/2007  Job #: [id number]

## 2011-01-01 NOTE — Letter (Signed)
May 08, 2007    Willow Ora, MD  [id number] [facility] [place], [place] [address]   RE:  [name], [name]  MRN:  [id number]  /  DOB:  Jan 13, 1961   Dear Dr. Drue Novel:   Upon your kind referral, I had the pleasure of evaluating your patient  and I am pleased to offer my findings.  I saw Brooke Phillips in the office  today.  Enclosed is a copy of my progress note that details my findings  and recommendations.   Thank you for the opportunity to participate in your patient's care.    Sincerely,      Barbette Hair. Arlyce Dice, MD,FACG  Electronically Signed    RDK/MedQ  DD: 05/08/2007  DT: 05/08/2007  Job #: [id number]

## 2011-01-01 NOTE — Assessment & Plan Note (Signed)
[facility]                         GASTROENTEROLOGY OFFICE NOTE   NAME:Brooke Phillips, CLARENCE DUNSMORE                        MRN:          [id number]  DATE:05/08/2007                            DOB:          05/20/1961    REASON FOR CONSULTATION:  Abdominal pain.   Ms. Maciver is a pleasant 50 year old white female referred through the  courtesy of Dr. Drue Novel for evaluation.  Over the last several months, she  has been complaining of immediate postprandial upper abdominal pain and  distention.  She denies nausea, pyrosis, or a change of bowel habits.  She is on no gastric irritants including nonsteroidals.  She does have a  history of ulcers from almost 18 years ago.  She denies use of gastric  irritants including nonsteroidals, excessive caffeine, or alcohol.   PAST MEDICAL HISTORY:  Pertinent for anxiety and depression.  She is  status post herniorrhaphy and tubal ligation.   FAMILY HISTORY:  Noncontributory.   MEDICATIONS:  Celexa, B12, and iron.   She has no allergies.  She neither smokes nor drinks.  She is married  and works as a Emergency planning/management officer.   REVIEW OF SYSTEMS:  Positive for night sweats and back pain.   On exam, she is healthy-appearing female.  Pulse 68, blood pressure  110/64, weight 147.   PHYSICAL EXAMINATION:  HEENT: EOMI.  PERRLA.  Sclerae are anicteric.  Conjunctivae are pink.  NECK:  Supple without thyromegaly, adenopathy or carotid bruits.  CHEST:  Clear to auscultation and percussion without adventitious  sounds.  CARDIAC:  Regular rhythm; normal S1 S2.  There are no murmurs, gallops  or rubs.  ABDOMEN:  She has mild mid epigastric tenderness without guarding or  rebound.  Bowel sounds are normoactive.  Abdomen is soft, nontender and  nondistended.  There are no abdominal masses or organomegaly.  EXTREMITIES:  Full range of motion.  No cyanosis, clubbing or edema.  RECTAL:  There are no masses.  Stool is Hemoccult negative.   Lab on  September 18 was pertinent for hemoglobin of 11.8, white count  3.8, MCV 91, and a normal basic metabolic panel.   IMPRESSION:  Nonspecific dyspepsia.  This could be due to ulcer or  nonulcer dyspepsia.   RECOMMENDATION:  1. Trial of Protonix 40 mg daily.  2. Upper endoscopy.     Barbette Hair. Arlyce Dice, MD,FACG  Electronically Signed    RDK/MedQ  DD: 05/08/2007  DT: 05/08/2007  Job #: [id number]   cc:   Willow Ora, MD

## 2011-01-04 NOTE — H&P (Signed)
   NAME:  Brooke Phillips, Brooke Phillips                           ACCOUNT NO.:  [id number]   MEDICAL RECORD NO.:  [id number]                   PATIENT TYPE:  AMB   LOCATION:  SDC                                  FACILITY:  [facility]   PHYSICIAN:  [place] B. Earlene Plater, M.D.               DATE OF BIRTH:  1960-10-18   DATE OF ADMISSION:  DATE OF DISCHARGE:                                HISTORY & PHYSICAL   SCHEDULED DATE OF PROCEDURE:  June 02, 2003   PREOPERATIVE DIAGNOSIS:  Abnormal uterine bleeding, saline infusion  ultrasound suggestive of endometrial polyp.   INTENDED PROCEDURE:  Hysteroscopy D&C.   HISTORY OF PRESENT ILLNESS:  A 50 year old white female gravida 2 para 2  with a greater than one year history of heavy and irregular bleeding.  Pelvic ultrasound in the office and subsequent saline infusion ultrasound  suggestive of endometrial polyp with a focal endometrial thickening.  No  other abnormalities were noted.   PAST MEDICAL HISTORY:  None.   PAST SURGICAL HISTORY:  Tubal ligation.   MEDICATIONS:  Celexa, Accutane, multivitamin.   ALLERGIES:  None.   SOCIAL HISTORY:  Occasional alcohol; no tobacco or other drugs.   FAMILY HISTORY:  Noncontributory.   REVIEW OF SYSTEMS:  Otherwise negative.   PHYSICAL EXAMINATION:  VITAL SIGNS:  Blood pressure 102/68, pulse 56, weight  143.  GENERAL:  Alert and oriented, no acute distress.  SKIN:  Warm and dry, no lesions.  HEART:  Regular rate and rhythm.  LUNGS:  Clear to auscultation.  ABDOMEN:  Liver and spleen normal; no hernia, no masses palpable.  PELVIC:  Normal external genitalia.  Vagina and cervix normal.  Uterus  anteverted, slightly enlarged.  No adnexal masses palpable.  Saline infusion  ultrasound suggestive of endometrial polyp as outlined above.    ASSESSMENT:  Irregular bleeding and probable endometrial polyp or focus of  proliferative endometrium.  Operative risks discussed including infection,  bleeding, uterine perforation,  fluid overload, damage to surrounding organs.  All questions answered; the patient wishes to proceed.                                               Gerri Spore B. Earlene Plater, M.D.    WBD/MEDQ  D:  05/25/2003  T:  05/25/2003  Job:  [id number]

## 2011-01-04 NOTE — Op Note (Signed)
   NAME:  Brooke Phillips, Brooke Phillips                           ACCOUNT NO.:  [id number]   MEDICAL RECORD NO.:  [id number]                   PATIENT TYPE:  AMB   LOCATION:  SDC                                  [facility]ILITY[facility]   PHYSICIAN:  [place] B. Earlene Plater, M.D.               DATE OF BIRTH:  [birth year]   DATE OF PROCEDURE:  06/02/2003  DATE OF DISCHARGE:                                 OPERATIVE REPORT   PREOPERATIVE DIAGNOSES:  1. Abnormal bleeding.  2. Possible endometrial polyps.   POSTOPERATIVE DIAGNOSES:  1. Abnormal bleeding.  2. Possible endometrial polyps.   PROCEDURES:  1. Hysteroscopy.  2. Dilatation and curettage.   SURGEON:  Chester Holstein. Earlene Plater, M.D.   ANESTHESIA:  LMA/general anesthesia.   FINDINGS:  A saggy, proliferative appearing endometrium.  No definitive  polyp seen.  Tubal ostia not seen due to proliferative endometrium.   FLUID REPLACEMENT:  20 mL Sorbitol.   ESTIMATED BLOOD LOSS:  Less than 50 mL.   COMPLICATIONS:  None.   INDICATIONS FOR PROCEDURE:  The patient has a history of irregular bleeding.  Pelvic ultrasound and subsequent saline infusion ultrasound suggestive of  focal endometrial lesion, polyp versus proliferative endometrium.   DESCRIPTION OF PROCEDURE:  The patient taken to the operating room and  LMA/general anesthesia obtained.  She was placed in the [place] stirrups and  prepped and draped in standard fashion.  Bladder emptied with red rubber  catheter.  The examination under anesthesia showed anteverted, slightly  enlarged uterus with no adnexal masses palpable.   Speculum inserted. Anterior lip of the cervix grasped with single-tooth  tenaculum.  Cervix dilated to #21 without difficulty.  The diagnostic  hysteroscope was inserted after being flushed with Sorbitol.   The endometrium appeared very proliferative.  This was diffuse throughout  the cavity.  There was no focal lesion identified and no definitive polyp  seen, although visualization was  somewhat limited due to the very  proliferative measure of tissue and some admixed blood.   The endometrium was sharply curetted and copious tissue returned.  This was  submitted to pathology as endometrial curettings.  Scope was reinserted and  no focal lesions identified.  Therefore, the procedure was terminated.   The patient tolerated the procedure well and there were no complications.  She was taken to the recovery room awake, alert and in stable condition.                                               Gerri Spore B. Earlene Plater, M.D.    WBD/MEDQ  D:  06/02/2003  T:  06/02/2003  Job:  [id number]

## 2011-01-04 NOTE — Op Note (Signed)
[place]  Patient:    Brooke Phillips, Brooke Phillips Visit Number: [id number] MRN: [id number]          Service Type: DSU Location: [facility] Attending Physician:  Gustavus Messing Dictated by:   Yaakov Guthrie. Shon Hough, M.D. Proc. Date: 10/19/01 Admit Date:  10/19/2001                             Operative Report  INDICATIONS:  A 50 year old lady with severe varicosity chain involving her left lower leg from the upper end of the thigh down to the distal fibular area. The area has had increased bulging, pain and discomfort - even when she sits sometimes.  She has tried over-the-counter medication including Tylenol, Advil, Motrin, elastic compression garments, and elevation with no avail.  She had the area previously treated in the past but with recurrence.  Exam shows patient with severe varicosities in the left saphenous system involving the left inner upper thigh area down to the distal fibular region.  There is evidence of only incision area over the area from previous surgery.  PROCEDURES DONE:  Total ligation of severe varicosities of the left lower leg medially and upper leg.  SURGEON:  Yaakov Guthrie. Shon Hough, M.D.  ANESTHESIA:  Sedation anesthesia supplemented by 1% Xylocaine plain.  DESCRIPTION OF PROCEDURE:  The patient was stood and and the vein was allowed to distend.  After distending I drew the varicosities from the upper left inner thigh down to the distal fibular region.  She then was placed in the prone position, was given sedation anesthesia.  Prep was done to the left lower leg, thigh, buttock areas, and foot with Betadine soap and solution and walled off with sterile towels and draped so as to make a sterile field. Plain Xylocaine was injected in the areas previously drawn and then multiple small longitudinal incisions were made over the drawn complex.  Then, using the mosquito hemostats, I was able to dissect gently the varicosity from  the upper end of the thigh all the way down to the lower leg, ligating off collaterals with 4-0 silk.  I was able to strip the whole area from the lower leg all the way up to the upper end of the thigh.  The distal and proximal ends were double-tied with 4-0 silk sutures.  After this, pressure was applied to all the areas and then the wounds were reclosed with multiple, multiple sutures of 6-0 Prolene and this was supplemented by Dermabond and half-inch Steri-Strips, Xeroform, 4x4s, ABDs, Kerlix, and Ace wraps.  She withstood the procedure very well, was then taken to recovery in excellent condition.  ESTIMATED BLOOD LOSS:  30 cc.  COMPLICATIONS:  None. Dictated by:   Yaakov Guthrie. Shon Hough, M.D. Attending Physician:  Gustavus Messing DD:  10/19/01 TD:  10/19/01 Job: [id number]

## 2011-01-04 NOTE — H&P (Signed)
   NAME:  Brooke Phillips, Brooke Phillips                           ACCOUNT NO.:  [id number]   MEDICAL RECORD NO.:  [id number]                   PATIENT TYPE:  AMB   LOCATION:  SDC                                  [facility]   PHYSICIAN:  [place] B. Earlene Plater, M.D.               DATE OF BIRTH:  [birth year]   DATE OF ADMISSION:  DATE OF DISCHARGE:                                HISTORY & PHYSICAL   NOTE:  He said he already dictated the history and physical on this patient,  so please disregard the remainder of this dictation.                                               Gerri Spore B. Earlene Plater, M.D.    WBD/MEDQ  D:  05/31/2003  T:  05/31/2003  Job:  [id number]

## 2011-02-06 ENCOUNTER — Other Ambulatory Visit: Payer: Self-pay | Admitting: Internal Medicine

## 2011-02-27 ENCOUNTER — Other Ambulatory Visit: Payer: Self-pay | Admitting: Internal Medicine

## 2011-02-27 NOTE — Telephone Encounter (Signed)
How many refills can we give pt?

## 2011-02-28 MED ORDER — ACYCLOVIR 800 MG PO TABS: ORAL_TABLET | ORAL | Status: DC

## 2011-02-28 NOTE — Telephone Encounter (Signed)
Correct sig ACYCLOVIR 800 MG  TABS three times a day as needed fever blisters x 5 days per episode #60, 3 RF

## 2011-04-06 ENCOUNTER — Emergency Department (HOSPITAL_BASED_OUTPATIENT_CLINIC_OR_DEPARTMENT_OTHER)
Admission: EM | Admit: 2011-04-06 | Discharge: 2011-04-07 | Disposition: A | Discharge status: Home / Self Care | Payer: 59 | Attending: Emergency Medicine | Admitting: Emergency Medicine

## 2011-04-06 ENCOUNTER — Emergency Department (INDEPENDENT_AMBULATORY_CARE_PROVIDER_SITE_OTHER): Payer: 59

## 2011-04-06 ENCOUNTER — Encounter: Payer: Self-pay | Admitting: *Deleted

## 2011-04-06 DIAGNOSIS — R509 Fever, unspecified: Secondary | ICD-10-CM

## 2011-04-06 DIAGNOSIS — J189 Pneumonia, unspecified organism: Secondary | ICD-10-CM

## 2011-04-06 DIAGNOSIS — R197 Diarrhea, unspecified: Secondary | ICD-10-CM

## 2011-04-06 DIAGNOSIS — R52 Pain, unspecified: Secondary | ICD-10-CM

## 2011-04-06 DIAGNOSIS — IMO0001 Reserved for inherently not codable concepts without codable children: Secondary | ICD-10-CM | POA: Insufficient documentation

## 2011-04-06 DIAGNOSIS — N390 Urinary tract infection, site not specified: Secondary | ICD-10-CM | POA: Insufficient documentation

## 2011-04-06 LAB — BASIC METABOLIC PANEL
BUN: 9 mg/dL (ref 6–23)
CO2: 25 mEq/L (ref 19–32)
Chloride: 104 mEq/L (ref 96–112)
Creatinine, Ser: 0.7 mg/dL (ref 0.50–1.10)
GFR calc Af Amer: >60 mL/min (ref >60)
Potassium: 3.2 mEq/L — ABNORMAL LOW (ref 3.5–5.1)

## 2011-04-06 LAB — URINALYSIS, ROUTINE W REFLEX MICROSCOPIC
Bilirubin Urine: NEGATIVE
Glucose, UA: NEGATIVE mg/dL
Nitrite: NEGATIVE
Specific Gravity, Urine: 1.023 (ref 1.005–1.030)
pH: 6.5 (ref 5.0–8.0)

## 2011-04-06 LAB — URINE MICROSCOPIC-ADD ON

## 2011-04-06 MED ORDER — SULFAMETHOXAZOLE-TRIMETHOPRIM 800-160 MG PO TABS: 1.0000 | ORAL_TABLET | Freq: Two times a day (BID) | ORAL | Status: DC

## 2011-04-06 MED ORDER — AZITHROMYCIN 250 MG PO TABS: 250.0000 mg | ORAL_TABLET | Freq: Every day | ORAL | Status: DC

## 2011-04-06 MED ORDER — SODIUM CHLORIDE 0.9 % IV BOLUS (SEPSIS)
1000.0000 mL | Freq: Once | INTRAVENOUS | Status: AC
  Administered 2011-04-06: 1000 mL via INTRAVENOUS

## 2011-04-06 MED ORDER — AZITHROMYCIN 250 MG PO TABS
500.0000 mg | ORAL_TABLET | Freq: Once | ORAL | Status: AC
  Administered 2011-04-06: 500 mg via ORAL
  Filled 2011-04-06: qty 2

## 2011-04-06 NOTE — ED Provider Notes (Signed)
History     CSN: [id number] Arrival date & time: 04/06/2011  8:11 PM  No chief complaint on file.  HPI Comments: Pt states that she also has a headache:pt states that she fells like she has a fever, but has not actually taken it  Patient is a 50 y.o. female presenting with diarrhea. The history is provided by the patient. No language interpreter was used.  Diarrhea The primary symptoms include diarrhea and myalgias. Primary symptoms do not include nausea, vomiting, dysuria or rash. The illness began 3 to 5 days ago. The onset was sudden. The problem has not changed since onset. The illness is also significant for chills.    Past Medical History  Diagnosis Date  . Depression     History reviewed. No pertinent past surgical history.  No family history on file.  History  Substance Use Topics  . Smoking status: Never Smoker   . Smokeless tobacco: Not on file  . Alcohol Use: No    OB History    Grav Para Term Preterm Abortions TAB SAB Ect Mult Living                  Review of Systems  Constitutional: Positive for chills.  Gastrointestinal: Positive for diarrhea. Negative for nausea and vomiting.  Genitourinary: Negative for dysuria.  Musculoskeletal: Positive for myalgias.  Skin: Negative for rash.  All other systems reviewed and are negative.    Physical Exam  BP 117/77  Pulse 80  Temp(Src) 98 F (36.7 C) (Oral)  Resp 18  Wt 136 lb (61.689 kg)  SpO2 98%  Physical Exam  Nursing note and vitals reviewed. Constitutional: She is oriented to person, place, and time. She appears well-developed and well-nourished.  HENT:  Head: Normocephalic.  Eyes: Pupils are equal, round, and reactive to light.  Neck: Normal range of motion. Neck supple.  Cardiovascular: Normal rate and regular rhythm.   Pulmonary/Chest: Effort normal and breath sounds normal.  Abdominal: Soft. Bowel sounds are normal.  Musculoskeletal: Normal range of motion.  Neurological: She is alert and  oriented to person, place, and time.  Skin: Skin is warm and dry.  Psychiatric: She has a normal mood and affect.    ED Course  Procedures Results for orders placed during the hospital encounter of 04/06/11  BASIC METABOLIC PANEL      Component Value Range   Sodium 141  135 - 145 (mEq/L)   Potassium 3.2 (*) 3.5 - 5.1 (mEq/L)   Chloride 104  96 - 112 (mEq/L)   CO2 25  19 - 32 (mEq/L)   Glucose, Bld 94  70 - 99 (mg/dL)   BUN 9  6 - 23 (mg/dL)   Creatinine, Ser 4.09  0.50 - 1.10 (mg/dL)   Calcium 9.5  8.4 - 81.1 (mg/dL)   GFR calc non Af Amer >60  >60 (mL/min)   GFR calc Af Amer >60  >60 (mL/min)  URINALYSIS, ROUTINE W REFLEX MICROSCOPIC      Component Value Range   Color, Urine YELLOW  YELLOW    Appearance CLOUDY (*) CLEAR    Specific Gravity, Urine 1.023  1.005 - 1.030    pH 6.5  5.0 - 8.0    Glucose, UA NEGATIVE  NEGATIVE (mg/dL)   Hgb urine dipstick LARGE (*) NEGATIVE    Bilirubin Urine NEGATIVE  NEGATIVE    Ketones, ur 15 (*) NEGATIVE (mg/dL)   Protein, ur NEGATIVE  NEGATIVE (mg/dL)   Urobilinogen, UA 1.0  0.0 -  1.0 (mg/dL)   Nitrite NEGATIVE  NEGATIVE    Leukocytes, UA LARGE (*) NEGATIVE   URINE MICROSCOPIC-ADD ON      Component Value Range   Squamous Epithelial / LPF MANY (*) RARE    WBC, UA 21-50  <3 (WBC/hpf)   RBC / HPF 11-20  <3 (RBC/hpf)   Bacteria, UA MANY (*) RARE    Urine-Other MUCOUS PRESENT     Dg Chest 2 View  04/06/2011  *RADIOLOGY REPORT*  Clinical Data: Fever, chills, diarrhea and body aches.  CHEST - 2 VIEW  Comparison: None.  Findings: The lungs are well-aerated.  Mild vague left midlung zone opacity could reflect very mild pneumonia.  There is no evidence of pleural effusion or pneumothorax.  The heart is normal in size; the mediastinal contour is within normal limits.  No acute osseous abnormalities are seen.  IMPRESSION: Mild vague left midlung zone opacity could reflect very mild pneumonia.  Original Report Authenticated By: Tonia Ghent, M.D.     MDM Will treat pt for uti and possible pneumonia based on the way the pt is feeling      Teressa Lower, NP 04/06/11 [id number]

## 2011-04-07 NOTE — ED Provider Notes (Signed)
Medical screening examination/treatment/procedure(s) were performed by non-physician practitioner and as supervising physician I was immediately available for consultation/collaboration.   Vida Roller, MD 04/07/11 [id number]

## 2011-04-08 ENCOUNTER — Ambulatory Visit (INDEPENDENT_AMBULATORY_CARE_PROVIDER_SITE_OTHER): Payer: 59 | Admitting: Internal Medicine

## 2011-04-08 ENCOUNTER — Encounter: Payer: Self-pay | Admitting: *Deleted

## 2011-04-08 VITALS — BP 122/72 | HR 67 | Temp 98.4°F | Resp 14 | Wt 141.4 lb

## 2011-04-08 DIAGNOSIS — N390 Urinary tract infection, site not specified: Secondary | ICD-10-CM

## 2011-04-08 DIAGNOSIS — F341 Dysthymic disorder: Secondary | ICD-10-CM

## 2011-04-08 DIAGNOSIS — F418 Other specified anxiety disorders: Secondary | ICD-10-CM | POA: Insufficient documentation

## 2011-04-08 DIAGNOSIS — K259 Gastric ulcer, unspecified as acute or chronic, without hemorrhage or perforation: Secondary | ICD-10-CM

## 2011-04-08 LAB — POCT URINALYSIS DIPSTICK
Bilirubin, UA: NEGATIVE
Glucose, UA: NEGATIVE
Spec Grav, UA: 1.015
pH, UA: 6.5

## 2011-04-08 LAB — CBC WITH DIFFERENTIAL/PLATELET
Basophils Relative: 0.4 % (ref 0.0–3.0)
Eosinophils Relative: 0.2 % (ref 0.0–5.0)
HCT: 40.4 % (ref 36.0–46.0)
Hemoglobin: 13.5 g/dL (ref 12.0–15.0)
Lymphs Abs: 1.4 10*3/uL (ref 0.7–4.0)
Monocytes Relative: 8.4 % (ref 3.0–12.0)
Neutro Abs: 4.2 10*3/uL (ref 1.4–7.7)
RBC: 4.11 Mil/uL (ref 3.87–5.11)
WBC: 6.2 10*3/uL (ref 4.5–10.5)

## 2011-04-08 MED ORDER — PANTOPRAZOLE SODIUM 40 MG PO TBEC: 40.0000 mg | DELAYED_RELEASE_TABLET | Freq: Every day | ORAL | Status: DC

## 2011-04-08 MED ORDER — LEVOFLOXACIN 500 MG PO TABS: 500.0000 mg | ORAL_TABLET | Freq: Every day | ORAL | Status: AC

## 2011-04-08 NOTE — Patient Instructions (Signed)
While on levaquin, decrease citalopram to 1/2 dose  Call if no better in few days Call anytime if the symptoms are severe, you have high fever, short of breath

## 2011-04-08 NOTE — Assessment & Plan Note (Signed)
H/o PUD, tender to plapation at the epigastrium rec to go back to protonix, check a CBC

## 2011-04-08 NOTE — Progress Notes (Signed)
  Subjective:    Patient ID: Brooke Phillips, female    DOB: 1960-12-23, 50 y.o.   MRN: [id number]  HPI Started to get sick 4 days ago: chills, or skin burning" watery diarrhea without blood. Went to the ER, records reviewed, BMP was normal except for a potassium of 3.2, urinalysis consistent with a UTI,  X-ray showed  a question of pneumonia, see report. She was prescribed a Z-Pak and Bactrim which she is taking. Since yesterday she is also having nausea and postprandial vomiting.  Past Medical History  Diagnosis Date  . Depression with anxiety 2008    started when husband dx w/  colon ca  . Anemia     NOS  . Gastric ulcer    Past Surgical History  Procedure Date  . Tubal ligation   . Hernia repair      Review of Systems No urinary frequency or abdominal pain. No dysuria. She did develop right flank pain, mild to moderate. Some subjective fever, no cough, runny nose, sore throat or sinus congestion. She felt mildly short of breath and has some anterior chest pressure since the onset of symptoms but that is getting better. Patient is under a lot of stress also, husband is currently in hospice No rash    Objective:   Physical Exam  Constitutional: She is oriented to person, place, and time. She appears well-developed and well-nourished.       Looks tired but nontoxic  Cardiovascular: Regular rhythm and normal heart sounds.   No murmur heard. Pulmonary/Chest: Effort normal and breath sounds normal. No respiratory distress. She has no wheezes. She has no rales.  Abdominal:       Moderately tender without mass or rebound in the epigastric area. She does have R CVA tenderness  Musculoskeletal: She exhibits no edema.  Neurological: She is alert and oriented to person, place, and time.  Psychiatric:       Obviously sad about her husband's health          Assessment & Plan:  Multiple sx x 4 days, most likely due to pyelonephritis +/- pneumonia, having N-V since started Zpack  and bactrim. UCX not sent from the ER. Patient is not tolerating well medicines, she really needs another antibiotic that covers both pyelonephritis and possible pneumonia. I am somehow concerned about the chest pressure but her presentation is not consistent with CAD rather with possible UTI-pneumonia. Plan: Change to levaquin, there is a potential interaction  with citalopram, will ask patient to decrease citalopram temporarily to half dose. CBC BMP Re asses in 2 weeks, call if sx severe

## 2011-04-08 NOTE — Assessment & Plan Note (Signed)
Husband in hospice, actually he is at the hospital as of today Counseled

## 2011-04-09 LAB — BASIC METABOLIC PANEL WITH GFR
BUN: 7 mg/dL (ref 6–23)
CO2: 22 mEq/L (ref 19–32)
Calcium: 9.3 mg/dL (ref 8.4–10.5)
Glucose, Bld: 82 mg/dL (ref 70–99)
Sodium: 142 mEq/L (ref 135–145)

## 2011-04-10 LAB — CULTURE, URINE COMPREHENSIVE: Organism ID, Bacteria: NO GROWTH

## 2011-04-11 ENCOUNTER — Telehealth: Payer: Self-pay | Admitting: *Deleted

## 2011-04-11 NOTE — Telephone Encounter (Signed)
Thank you, agree. 

## 2011-04-11 NOTE — Telephone Encounter (Signed)
Patient informed. Pt states she is feeling "somewhat better, but not herself"; still feeling fatigued. Patient informed to call back & schedule ROV if no improvement in next few days and/or if symptoms worsen.

## 2011-04-11 NOTE — Telephone Encounter (Signed)
Message copied by Regis Bill on Thu Apr 11, 2011 12:21 PM ------      Message from: Willow Ora E      Created: Wed Apr 10, 2011  8:57 AM       Advise patient      Urine culture negative, likely because she took antibiotics before urine collected.      Plan is the same.      Is she feeling better?

## 2011-04-19 ENCOUNTER — Encounter: Payer: Self-pay | Admitting: Internal Medicine

## 2011-04-19 ENCOUNTER — Ambulatory Visit (INDEPENDENT_AMBULATORY_CARE_PROVIDER_SITE_OTHER): Payer: 59 | Admitting: Internal Medicine

## 2011-04-19 DIAGNOSIS — K259 Gastric ulcer, unspecified as acute or chronic, without hemorrhage or perforation: Secondary | ICD-10-CM

## 2011-04-19 DIAGNOSIS — F418 Other specified anxiety disorders: Secondary | ICD-10-CM

## 2011-04-19 DIAGNOSIS — F341 Dysthymic disorder: Secondary | ICD-10-CM

## 2011-04-19 DIAGNOSIS — J189 Pneumonia, unspecified organism: Secondary | ICD-10-CM | POA: Insufficient documentation

## 2011-04-19 DIAGNOSIS — J309 Allergic rhinitis, unspecified: Secondary | ICD-10-CM | POA: Insufficient documentation

## 2011-04-19 NOTE — Assessment & Plan Note (Addendum)
Improving, status post Avelox, plans to get her chest x-ray f/u next week. She still has some diarrhea,? To  related to antibiotic intake. Recommend probiotics and observation. Will let me know if not better

## 2011-04-19 NOTE — Assessment & Plan Note (Signed)
See history of present illness, as he felt some symptoms consistent with allergies, over-the-counter antihistamine makes caused some insomnia. Samples of Qnsal  provided as well as a paper prescription.

## 2011-04-19 NOTE — Progress Notes (Signed)
  Subjective:    Patient ID: Brooke Phillips, female    DOB: [birth year], 50 y.o.   MRN: [id number]  HPI Followup from last office visit. Was seen with pneumonia, she finished  Avelox. Was also seen with nausea and vomiting, was restarted on Protonix and changed antibiotics, all that is better. She has developed allergy type of symptoms like sneezing and nasal congestion, took antihistaminics and had a difficult time sleeping. As far as the depression, we temporarily decreased the dose of Celexa to half (because she was on abx ) she again noticed  some difficulty sleeping. She continued with diarrhea for the last 2-3 weeks, no blood in the stools. Has developed some ill defined facial paresthesia, from the cheeks down to the jaw, bilateral, worse at night, no rash. Denies any slurred speech, facial weakness or motor deficits.   Past Medical History  Diagnosis Date  . Depression with anxiety 2008    started when husband dx w/  colon ca  . Anemia     NOS  . Gastric ulcer    Past Surgical History  Procedure Date  . Tubal ligation   . Hernia repair     Review of Systems See HPI     Objective:   Physical Exam  Constitutional: She is oriented to person, place, and time. She appears well-developed and well-nourished.       Nontoxic, seems in better spirits today.  HENT:  Head: Normocephalic and atraumatic.       Not tender to palpation, nose not congested  Cardiovascular: Normal rate, regular rhythm and normal heart sounds.   No murmur heard. Pulmonary/Chest: Effort normal and breath sounds normal. No respiratory distress. She has no wheezes. She has no rales.  Abdominal:       Nondistended, soft, mild epigastric tenderness, better compared to few  days ago  Neurological: She is alert and oriented to person, place, and time.       Assessment & Plan:  Facial paresthesias, She has facial paresthesias, her skin still "burns in the arms, unclear etiology. Recommend observation for  now. If not better will need further workup.  Today , I spent more than 25 min with the patient, >50% of the time counseling and going over each one of her sx

## 2011-04-19 NOTE — Assessment & Plan Note (Signed)
she restarted Protonix and feels better. Still tender to palpation in the abdomen but better compared to last OV.

## 2011-04-19 NOTE — Assessment & Plan Note (Signed)
Was advised to temporarily decrease Celexa to half. Having some difficulty sleeping. I anticipate that now that she is going back to full dose of Celexa she will do better. Overall, she seems in better spirits. She now is follow up with Dr. Dub Mikes monthly according to the patient.

## 2011-04-23 ENCOUNTER — Ambulatory Visit: Payer: 59 | Admitting: Internal Medicine

## 2011-04-23 DIAGNOSIS — Z0289 Encounter for other administrative examinations: Secondary | ICD-10-CM

## 2011-05-13 ENCOUNTER — Other Ambulatory Visit: Payer: Self-pay | Admitting: Internal Medicine

## 2011-05-13 MED ORDER — ALPRAZOLAM 0.5 MG PO TABS: 1.5000 mg | ORAL_TABLET | Freq: Every evening | ORAL | Status: DC | PRN

## 2011-05-13 NOTE — Telephone Encounter (Signed)
Done

## 2011-05-13 NOTE — Telephone Encounter (Signed)
Alprazolam request [10/22/10 last refill #90x1 last OV 04/19/11]

## 2011-05-13 NOTE — Telephone Encounter (Signed)
Ok 90 and 1 RF 

## 2011-06-12 ENCOUNTER — Other Ambulatory Visit: Payer: Self-pay | Admitting: Internal Medicine

## 2011-06-12 NOTE — Telephone Encounter (Signed)
Pt requested refill of alprazalam .5mg 

## 2011-06-13 ENCOUNTER — Telehealth: Payer: Self-pay | Admitting: *Deleted

## 2011-06-13 NOTE — Telephone Encounter (Signed)
Alprazolam request [last refill 05/13/11 #90x1 - early request w/Sig: 3 PO at bedtime PRN].

## 2011-06-13 NOTE — Telephone Encounter (Signed)
1. Is too early , has enough until 07-13-11 2. She sees Dr Dub Mikes monthly , is he Rx any meds?

## 2011-06-14 MED ORDER — ALPRAZOLAM 0.5 MG PO TABS: 1.5000 mg | ORAL_TABLET | Freq: Every evening | ORAL | Status: DC | PRN

## 2011-06-14 NOTE — Telephone Encounter (Signed)
Per pharmacy did Not receive [1] refill requested previously - authorized #90x0.

## 2011-06-14 NOTE — Telephone Encounter (Signed)
[facility] on last request per pharmacy-given #90x0.

## 2011-07-02 ENCOUNTER — Other Ambulatory Visit: Payer: Self-pay | Admitting: Internal Medicine

## 2011-07-30 ENCOUNTER — Other Ambulatory Visit: Payer: Self-pay | Admitting: *Deleted

## 2011-07-30 NOTE — Telephone Encounter (Signed)
Pt left VM that she is currently out of town and left Rx at home and would like to [facility] at [phone]Left message to call office unable to find pharmacy advise Pt to return call to clarify pharmacy.

## 2011-07-31 ENCOUNTER — Other Ambulatory Visit: Payer: Self-pay | Admitting: Internal Medicine

## 2011-07-31 MED ORDER — ACYCLOVIR 800 MG PO TABS: ORAL_TABLET | ORAL | Status: DC

## 2011-07-31 NOTE — Telephone Encounter (Signed)
See other note. Rx already sent.

## 2011-07-31 NOTE — Telephone Encounter (Signed)
Pt return call with info, Rx sent to pharmacy left Pt detail message.

## 2011-08-22 ENCOUNTER — Ambulatory Visit: Payer: 59 | Admitting: Internal Medicine

## 2011-09-04 ENCOUNTER — Other Ambulatory Visit: Payer: Self-pay | Admitting: Internal Medicine

## 2011-09-09 NOTE — Telephone Encounter (Signed)
rx sent to pharmacy by e-script  

## 2011-10-03 ENCOUNTER — Other Ambulatory Visit: Payer: Self-pay | Admitting: *Deleted

## 2011-10-03 MED ORDER — ACYCLOVIR 800 MG PO TABS: ORAL_TABLET | ORAL | Status: DC

## 2011-10-03 NOTE — Telephone Encounter (Signed)
Pt aware Rx sent.  

## 2011-11-08 ENCOUNTER — Other Ambulatory Visit: Payer: Self-pay | Admitting: Internal Medicine

## 2011-11-08 NOTE — Telephone Encounter (Signed)
Refill done.  

## 2012-01-07 ENCOUNTER — Other Ambulatory Visit: Payer: Self-pay | Admitting: Internal Medicine

## 2012-01-07 DIAGNOSIS — Z1231 Encounter for screening mammogram for malignant neoplasm of breast: Secondary | ICD-10-CM

## 2012-01-08 ENCOUNTER — Ambulatory Visit (HOSPITAL_BASED_OUTPATIENT_CLINIC_OR_DEPARTMENT_OTHER): Payer: 59

## 2012-01-15 ENCOUNTER — Ambulatory Visit (HOSPITAL_BASED_OUTPATIENT_CLINIC_OR_DEPARTMENT_OTHER)
Admission: RE | Admit: 2012-01-15 | Discharge: 2012-01-15 | Disposition: A | Discharge status: Home / Self Care | Payer: 59 | Source: Ambulatory Visit | Attending: Internal Medicine | Admitting: Internal Medicine

## 2012-01-15 DIAGNOSIS — Z1231 Encounter for screening mammogram for malignant neoplasm of breast: Secondary | ICD-10-CM | POA: Insufficient documentation

## 2012-01-22 ENCOUNTER — Other Ambulatory Visit: Payer: Self-pay | Admitting: Internal Medicine

## 2012-01-22 DIAGNOSIS — R928 Other abnormal and inconclusive findings on diagnostic imaging of breast: Secondary | ICD-10-CM

## 2012-01-29 ENCOUNTER — Ambulatory Visit
Admission: RE | Admit: 2012-01-29 | Discharge: 2012-01-29 | Disposition: A | Discharge status: Home / Self Care | Payer: 59 | Source: Ambulatory Visit | Attending: Internal Medicine | Admitting: Internal Medicine

## 2012-01-29 DIAGNOSIS — R928 Other abnormal and inconclusive findings on diagnostic imaging of breast: Secondary | ICD-10-CM

## 2012-03-17 ENCOUNTER — Ambulatory Visit (INDEPENDENT_AMBULATORY_CARE_PROVIDER_SITE_OTHER): Payer: 59 | Admitting: Internal Medicine

## 2012-03-17 ENCOUNTER — Encounter: Payer: Self-pay | Admitting: Internal Medicine

## 2012-03-17 VITALS — BP 130/86 | HR 52 | Temp 98.0°F | Wt 145.0 lb

## 2012-03-17 DIAGNOSIS — F341 Dysthymic disorder: Secondary | ICD-10-CM

## 2012-03-17 DIAGNOSIS — F418 Other specified anxiety disorders: Secondary | ICD-10-CM

## 2012-03-17 DIAGNOSIS — M25529 Pain in unspecified elbow: Secondary | ICD-10-CM

## 2012-03-17 NOTE — Patient Instructions (Addendum)
Will arrange a referral to see a sports medicine doctor, if you don't  hear from [place] in 4-5 days, let us know

## 2012-03-17 NOTE — Progress Notes (Signed)
  Subjective:    Patient ID: Brooke Phillips, female    DOB: 08/19/1961, 51 y.o.   MRN: [id number]  HPI Acute visit 2 months history of left elbow pain, few years ago was diagnosed with tennis elbow, did physical therapy for several weeks, did not get better until she got a local ejection. Patient requests a referral for a local ejection.  Past Medical History: G2 P2 Anxiety, Depression-symptoms started ~ 01/19/07 when husband was dx w/ colon ca Anemia-NOS gastric ulcer  Past Surgical History: tubal ligation hernia surgery  Social History: Widow,husband dx w/ colon ca (Dx 2007/01/19), died Jan 19, 2011 two children one w/ special needs (71 y/o) , autistic [employer] cabin [employer] Patient has never smoked.  Alcohol Use - yes  occasionally Illicit Drug Use - no   Review of Systems Denies any specific injury to the elbow, no swelling or redness. She has been slightly more active lately but no injury or elbow overuse Right elbow without any pain.    Objective:   Physical Exam  Alert oriented x3, no apparent distress. Elbows symmetric, not tender to palpation, no swelling or redness. Left elbow pain was elicited with forced extension.      Assessment & Plan:

## 2012-03-17 NOTE — Assessment & Plan Note (Signed)
Lost her husband last year, fortunately she is doing ok, sx well controlled

## 2012-03-17 NOTE — Assessment & Plan Note (Signed)
Symptoms consistent with tennis elbow, refer to sports medicine as historically, she responds very well to local injections.

## 2012-03-24 ENCOUNTER — Encounter: Payer: Self-pay | Admitting: Family Medicine

## 2012-03-24 ENCOUNTER — Ambulatory Visit (INDEPENDENT_AMBULATORY_CARE_PROVIDER_SITE_OTHER): Payer: 59 | Admitting: Family Medicine

## 2012-03-24 VITALS — BP 127/78 | HR 61 | Ht 67.0 in | Wt 145.0 lb

## 2012-03-24 DIAGNOSIS — M25529 Pain in unspecified elbow: Secondary | ICD-10-CM

## 2012-03-24 NOTE — Patient Instructions (Addendum)
You have lateral epicondylitis Try to avoid painful activities as much as possible. Ice the area 3-4 times a day for 15 minutes at a time. Tylenol or aleve as needed for pain. Counterforce brace as directed can help unload area - wear this regularly if it provides you with relief. Home Pronation/supination with 1 pound weight, wrist extension, stretching - do these once a day starting in 5-7 days. Consider formal physical therapy. Consider injection for short term pain relief if the above is not helping - you were given this today. Surgery is a consideration if exercises and injection are not providing enough relief. Follow up with me in 1 month or as needed.

## 2012-03-26 ENCOUNTER — Encounter: Payer: Self-pay | Admitting: Family Medicine

## 2012-03-26 NOTE — Progress Notes (Signed)
Subjective:    Patient ID: Brooke Phillips, female    DOB: 1960/12/11, 51 y.o.   MRN: [id number]  PCP: Dr. Drue Novel  HPI 51 yo F here for left elbow pain.  Denies known injury. She states about 2 months ago started to develop lateral left elbow pain. Worse with weight lifting, wrist extension, finger movements. Had similar symptoms about 5 years ago - went through protocol, exercises, medicines - only resolved with injection. Has been taking advil. No swelling or bruising. Is right handed.  Past Medical History  Diagnosis Date  . Depression with anxiety 2008    started when husband dx w/  colon ca  . Anemia     NOS  . Gastric ulcer     Current Outpatient Prescriptions on File Prior to Visit  Medication Sig Dispense Refill  . acyclovir (ZOVIRAX) 800 MG tablet three times a day as needed fever blisters x 5 days per episode  60 tablet  0  . ALPRAZolam (XANAX) 0.5 MG tablet Take 3 tablets (1.5 mg total) by mouth at bedtime as needed. Take 0.5 - 1.5 mg PO QHs Prn.  90 tablet  0  . buPROPion (WELLBUTRIN XL) 150 MG 24 hr tablet Take 75 mg by mouth daily.       . citalopram (CELEXA) 20 MG tablet Take 20 mg by mouth daily.       . LO LOESTRIN FE 1 MG-10 MCG / 10 MCG tablet       . Multiple Vitamins-Minerals ([facility] DAILY GUMMIES PO) Take 1 tablet by mouth daily.        . pantoprazole (PROTONIX) 40 MG tablet TAKE 1 TABLET (40 MG TOTAL) BY MOUTH DAILY.  30 tablet  3  . psyllium (METAMUCIL) 58.6 % powder Take 1 packet by mouth daily.          Past Surgical History  Procedure Date  . Tubal ligation   . Hernia repair     No Known Allergies  History   Social History  . Marital Status: Widowed    Spouse Name: N/A    Number of Children: 2  . Years of Education: N/A   Occupational History  . manager [employer]   Social History Main Topics  . Smoking status: Former Games developer  . Smokeless tobacco: Not on file   Comment: >30 years ago  . Alcohol Use: Yes     occasionally  . Drug  Use: No  . Sexually Active: Not on file   Other Topics Concern  . 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 Breast cancer Neg Hx   . Diabetes Neg Hx   . Heart attack Neg Hx   . Hyperlipidemia Neg Hx   . Hypertension Neg Hx   . Esophageal cancer Father   . Sudden death Mother     BP 127/78  Pulse 61  Ht 5\' 7"  (1.702 m)  Wt 145 lb (65.772 kg)  BMI 22.71 kg/m2  Review of Systems See HPI above.    Objective:   Physical Exam Gen: NAD  L elbow: No gross deformity, swelling, bruising. TTP at lateral epicondyle and just distal to this. FROM  wrist and elbow. Pain on resisted wrist extension and 3rd finger extension reproducing pain at elbow. NVI distally.  R elbow: FROM without pain.     Assessment & Plan:  1. Left lateral epicondylitis - shown home exercises again to do regularly.  Declined counterforce or wrist brace.  Given injection as this helped significantly in the past.  Continue icing, tylenol/nsaids as needed (must be careful with h/o dyspepsia).  F/u in 1 month or prn.  After informed written consent patient was seated on exam table.  Area overlying just distal to left lateral epicondyle in area of maximal pain prepped with alcohol swab then injected with 2:1 marcaine: depomedrol with several needle passes.  Patient tolerated procedure well without immediate complications.

## 2012-03-26 NOTE — Assessment & Plan Note (Signed)
shown home exercises again to do regularly.  Declined counterforce or wrist brace.  Given injection as this helped significantly in the past.  Continue icing, tylenol/nsaids as needed (must be careful with h/o dyspepsia).  F/u in 1 month or prn.  After informed written consent patient was seated on exam table.  Area overlying just distal to left lateral epicondyle in area of maximal pain prepped with alcohol swab then injected with 2:1 marcaine: depomedrol with several needle passes.  Patient tolerated procedure well without immediate complications.

## 2012-04-01 ENCOUNTER — Other Ambulatory Visit: Payer: Self-pay | Admitting: Internal Medicine

## 2012-05-21 ENCOUNTER — Encounter: Payer: 59 | Admitting: Internal Medicine

## 2012-06-15 ENCOUNTER — Other Ambulatory Visit: Payer: Self-pay

## 2012-06-15 NOTE — Telephone Encounter (Signed)
OV 03/24/12. Last filled 06/14/11 #60 no refills   PLz advise    MW

## 2012-06-16 MED ORDER — ALPRAZOLAM 0.5 MG PO TABS: 1.5000 mg | ORAL_TABLET | Freq: Every evening | ORAL | Status: DC | PRN

## 2012-06-16 NOTE — Telephone Encounter (Signed)
done

## 2012-07-02 ENCOUNTER — Other Ambulatory Visit: Payer: Self-pay | Admitting: Internal Medicine

## 2012-07-02 NOTE — Telephone Encounter (Signed)
Refill done.  

## 2012-07-03 ENCOUNTER — Other Ambulatory Visit: Payer: Self-pay

## 2012-07-03 ENCOUNTER — Other Ambulatory Visit: Payer: Self-pay | Admitting: Internal Medicine

## 2012-07-03 NOTE — Telephone Encounter (Signed)
Refill done.  

## 2012-07-03 NOTE — Telephone Encounter (Signed)
Pt called [facility] triage line asking for Zovirax after reviewing pt chart I am showing Rx already sent. I called pt [facility] to advise Rx sent to pharmacy.      MW

## 2012-08-05 ENCOUNTER — Encounter: Payer: Self-pay | Admitting: Internal Medicine

## 2012-08-05 ENCOUNTER — Encounter: Payer: 59 | Admitting: Internal Medicine

## 2012-08-05 ENCOUNTER — Ambulatory Visit (INDEPENDENT_AMBULATORY_CARE_PROVIDER_SITE_OTHER): Payer: 59 | Admitting: Internal Medicine

## 2012-08-05 VITALS — BP 136/84 | HR 70 | Temp 97.9°F | Resp 18 | Wt 151.0 lb

## 2012-08-05 DIAGNOSIS — Z8719 Personal history of other diseases of the digestive system: Secondary | ICD-10-CM

## 2012-08-05 DIAGNOSIS — Z8639 Personal history of other endocrine, nutritional and metabolic disease: Secondary | ICD-10-CM

## 2012-08-05 DIAGNOSIS — Z8711 Personal history of peptic ulcer disease: Secondary | ICD-10-CM

## 2012-08-05 DIAGNOSIS — N951 Menopausal and female climacteric states: Secondary | ICD-10-CM

## 2012-08-05 DIAGNOSIS — Z0000 Encounter for general adult medical examination without abnormal findings: Secondary | ICD-10-CM

## 2012-08-05 DIAGNOSIS — E785 Hyperlipidemia, unspecified: Secondary | ICD-10-CM

## 2012-08-05 LAB — POCT URINALYSIS DIPSTICK
Bilirubin, UA: NEGATIVE
Blood, UA: NEGATIVE
Ketones, UA: NEGATIVE
Protein, UA: NEGATIVE
pH, UA: 6.5

## 2012-08-05 NOTE — Patient Instructions (Addendum)
CAll Dr. Algie Coffer to follow up on abnormal pap  [phone]  Labs will be mailed to you

## 2012-08-06 ENCOUNTER — Encounter: Payer: Self-pay | Admitting: Internal Medicine

## 2012-08-06 DIAGNOSIS — E785 Hyperlipidemia, unspecified: Secondary | ICD-10-CM | POA: Insufficient documentation

## 2012-08-06 DIAGNOSIS — Z8639 Personal history of other endocrine, nutritional and metabolic disease: Secondary | ICD-10-CM | POA: Insufficient documentation

## 2012-08-06 DIAGNOSIS — Z8711 Personal history of peptic ulcer disease: Secondary | ICD-10-CM | POA: Insufficient documentation

## 2012-08-06 DIAGNOSIS — Z8719 Personal history of other diseases of the digestive system: Secondary | ICD-10-CM | POA: Insufficient documentation

## 2012-08-06 NOTE — Progress Notes (Addendum)
Subjective:    Patient ID: Brooke Phillips, female    DOB: Mar 10, 1961, 51 y.o.   MRN: [id number]  HPI Brooke Phillips is a new pt here for first visit.  Primary is Dr. Drue Novel that pt wishes to keep.   PMH of depression, anxiety, gastric ulcer in 2008, vitamin D deficiency and abnormal pap smear.  She is on Accutane with Dr. Marge Duncans reports she lost her husband at age 31 from colon cancer.  She is a single mom raising two children, one who is autistic  See cytology labs.  She had pap done 10/2010 by Dr. Ernestina Penna with result of LGSIL and a few cells suggestive of higher grade lesion.  She thinks dr. Ernestina Penna may have done a biopsy but she has not follow ed up as "things were crazy with my husband being sick"  She has labs from Dr. Margo Aye see scanned labs.  LDL 152  hdl 81  Total 254  She also states she is having hot flushes day and night and is wondering if HT would help  No Known Allergies Past Medical History  Diagnosis Date  . Depression with anxiety 2008    started when husband dx w/  colon ca  . Anemia     NOS  . Gastric ulcer    Past Surgical History  Procedure Date  . Tubal ligation   . Hernia repair    History   Social History  . Marital Status: Widowed    Spouse Name: N/A    Number of Children: 2  . Years of Education: N/A   Occupational History  . manager [employer]   Social History Main Topics  . Smoking status: Former Games developer  . Smokeless tobacco: Not on file     Comment: >30 years ago  . Alcohol Use: Yes     Comment: occasionally  . Drug Use: No  . Sexually Active: No   Other Topics Concern  . 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 Breast cancer Neg Hx   . Diabetes Neg Hx   . Heart attack Neg Hx   . Hyperlipidemia Neg Hx   . Hypertension Neg Hx   . Esophageal cancer Father   . Sudden death Mother    Patient Active Problem List  Diagnosis  . ANEMIA-NOS  . DYSPEPSIA  . WEIGHT GAIN  . DYSPHAGIA UNSPECIFIED  . PAP SMEAR, ABNORMAL  . Depression with anxiety  . Allergic rhinitis  . Elbow pain  . History of gastric ulcer  . History of vitamin D deficiency   Current Outpatient Prescriptions on File Prior to Visit  Medication Sig Dispense Refill  . acyclovir (ZOVIRAX) 800 MG tablet TAKE TABLETS THREE TIMES A DAY AS NEEDED FOR FEVER BLISTERS X 5 DAYS PER EPISODE  60 tablet  0  . ALPRAZolam (XANAX) 0.5 MG tablet Take 3 tablets (1.5 mg total) by mouth at bedtime as needed. Take 0.5 - 1.5 mg PO QHs Prn.  90 tablet  3  . buPROPion (WELLBUTRIN XL) 150 MG 24 hr tablet Take 75 mg by mouth daily.       . citalopram (CELEXA) 20 MG tablet  Take 20 mg by mouth daily.       . Multiple Vitamins-Minerals ([facility] DAILY GUMMIES PO) Take 1 tablet by mouth daily.        . pantoprazole (PROTONIX) 40 MG tablet TAKE 1 TABLET (40 MG TOTAL) BY MOUTH DAILY.  30 tablet  3  . diazepam (VALIUM) 10 MG tablet       . LO LOESTRIN FE 1 MG-10 MCG / 10 MCG tablet       . psyllium (METAMUCIL) 58.6 % powder Take 1 packet by mouth daily.               Review of Systems  All other systems reviewed and are negative.       Objective:   Physical Exam Physical Exam  Nursing note and vitals reviewed.  Constitutional: She is oriented to person, place, and time. She appears well-developed and well-nourished.  HENT:  Head: Normocephalic and atraumatic.  Right Ear: Tympanic membrane and ear canal normal. No drainage. Tympanic membrane is not injected and not erythematous.  Left Ear: Tympanic membrane and ear canal normal. No drainage. Tympanic membrane is not injected and  not erythematous.  Nose: Nose normal. Right sinus exhibits no maxillary sinus tenderness and no frontal sinus tenderness. Left sinus exhibits no maxillary sinus tenderness and no frontal sinus tenderness.  Mouth/Throat: Oropharynx is clear and moist. No oral lesions. No oropharyngeal exudate.  Eyes: Conjunctivae and EOM are normal. Pupils are equal, round, and reactive to light.  Neck: Normal range of motion. Neck supple. No JVD present. Carotid bruit is not present. No mass and no thyromegaly present.  Cardiovascular: Normal rate, regular rhythm, S1 normal, S2 normal and intact distal pulses. Exam reveals no gallop and no friction rub.  No murmur heard.  Pulses:  Carotid pulses are 2+ on the right side, and 2+ on the left side.  Dorsalis pedis pulses are 2+ on the right side, and 2+ on the left side.  No carotid bruit. No LE edema  Pulmonary/Chest: Breath sounds normal. She has no wheezes. She has no rales. She exhibits no tenderness. Breast no discrete masses no nipple discharge no axillary adenopathy bilaterally Abdominal: Soft. Bowel sounds are normal. She exhibits no distension and no mass. There is no hepatosplenomegaly. There is no tenderness. There is no CVA tenderness.  Musculoskeletal: Normal range of motion.  No active synovitis to joints.  Lymphadenopathy:  She has no cervical adenopathy.  She has no axillary adenopathy.  Right: No inguinal and no supraclavicular adenopathy present.  Left: No inguinal and no supraclavicular adenopathy present.  Neurological: She is alert and oriented to person, place, and time. She has normal strength and normal reflexes. She displays no tremor. No cranial nerve deficit or sensory deficit. Coordination and gait normal.  Skin: Skin is warm and dry. No rash noted. No cyanosis. Nails show no clubbing.  Psychiatric: She has a normal mood and affect. Her speech is normal and behavior is normal. Cognition and memory are normal.            Assessment & Plan:  Health Maintenance  MM machine not operable today .  Pt advised to schedule appt in imaging  UTD with vaccines.  Colonoscopy 2010 Dr. Manual Meier diet given.   MM done 01/2012  History of LGSIL  Pt counseled to make appt with her gyn Dr. Ernestina Penna for follow up of an abnormal pap  Hyperlipidemia  Follow dash diet for now  Perimenopause  Sweats may be due  to menopause.  Extensive educations and risk/benefit handout givne to pt.  I advised it is not wise to take HT now with abnormal pap.  She will also get opinion of GYN  History of anemia  Will check today  Depression with anxiety  Follow with Dr. Dub Mikes  History of gastric ulcer  2008 upper endoscopy  History of vitamin D deficiency  Will check today

## 2012-08-07 LAB — FOLLICLE STIMULATING HORMONE: FSH: 23.3 m[IU]/mL

## 2012-08-13 ENCOUNTER — Encounter: Payer: Self-pay | Admitting: *Deleted

## 2012-08-17 ENCOUNTER — Encounter: Payer: Self-pay | Admitting: *Deleted

## 2012-10-30 ENCOUNTER — Other Ambulatory Visit: Payer: Self-pay | Admitting: Internal Medicine

## 2012-10-30 NOTE — Telephone Encounter (Signed)
Last filled 06-15-12 #90 3, last OV 03-17-12

## 2012-11-03 ENCOUNTER — Other Ambulatory Visit: Payer: Self-pay | Admitting: *Deleted

## 2012-11-03 NOTE — Telephone Encounter (Signed)
Patient left message on triage requesting refill for xanax.  Rx faxed to pharmacy but did not go through on 10/30/12. Rx called to pharmacy.

## 2012-11-04 ENCOUNTER — Ambulatory Visit: Payer: 59 | Admitting: Internal Medicine

## 2012-11-12 ENCOUNTER — Other Ambulatory Visit: Payer: Self-pay | Admitting: Internal Medicine

## 2012-11-12 NOTE — Telephone Encounter (Signed)
Refill done.  

## 2012-11-20 IMAGING — CR DG CHEST 2V
2 series · 2 of 2 positions shown · non-contrast
Comparison: None.

CLINICAL DATA: Fever, chills, diarrhea and body aches.

CHEST - 2 VIEW

[w chest pa]
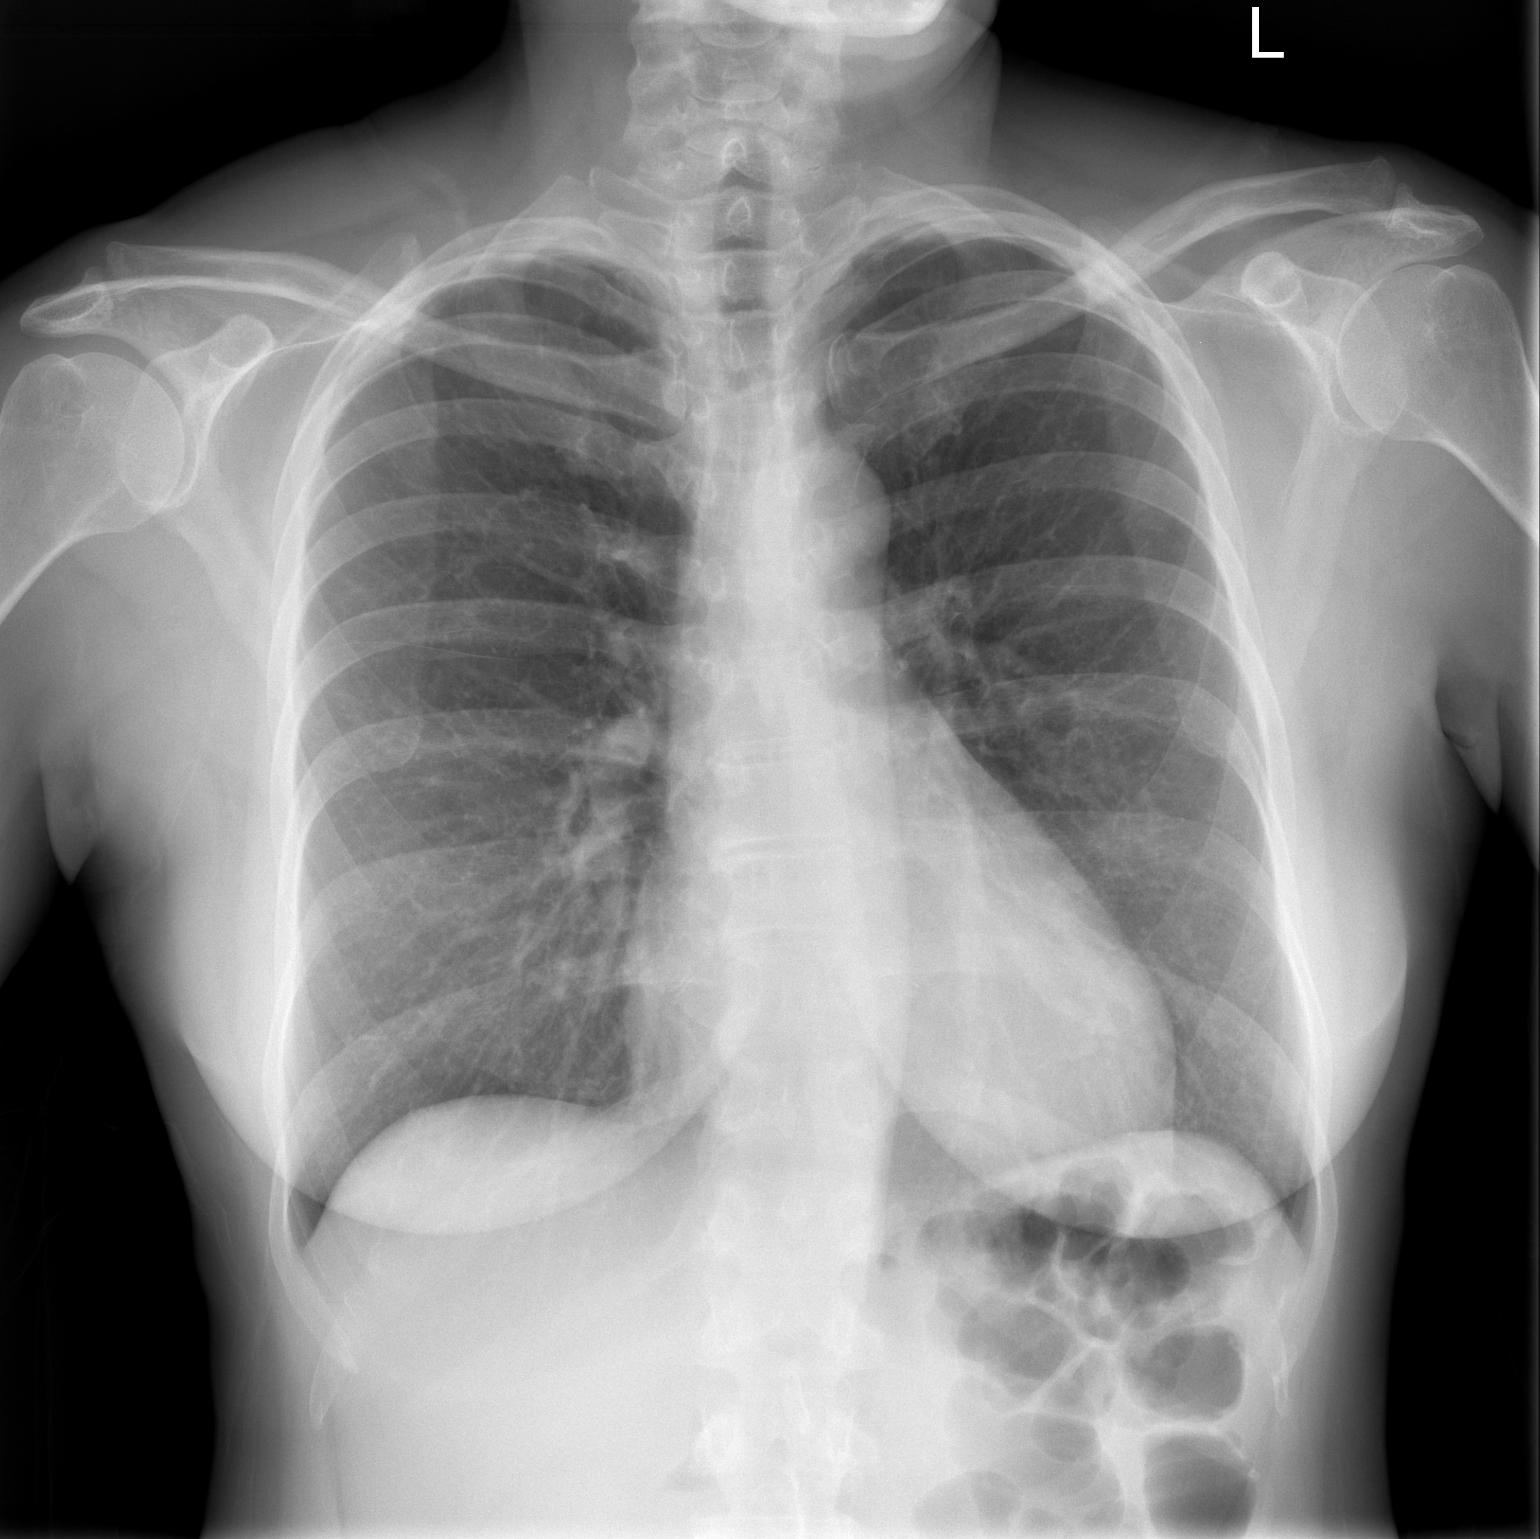

[w chest lat]
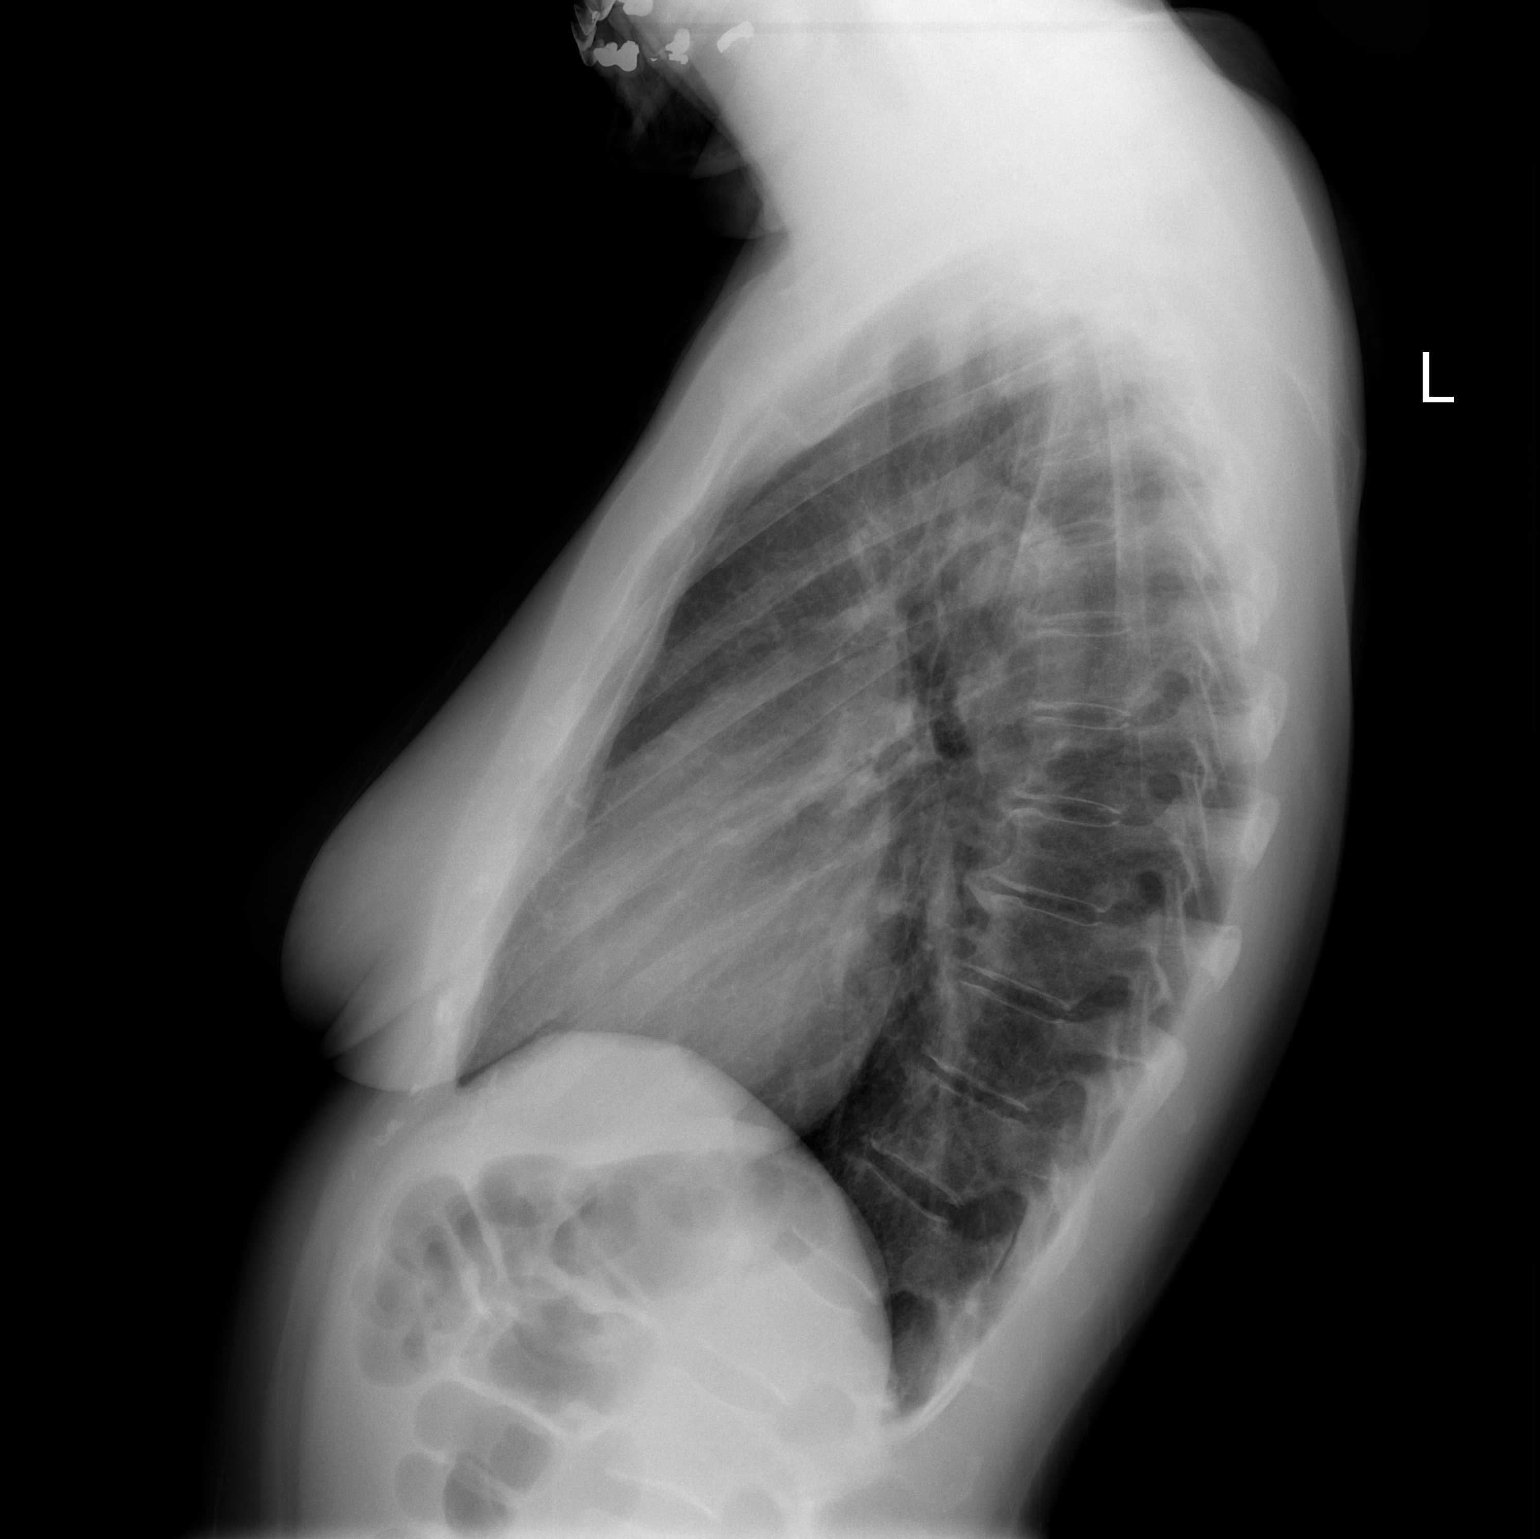

[2 of 2 positions shown; findings below may reference images not displayed]

FINDINGS: The lungs are well-aerated.  Mild vague left midlung zone
opacity could reflect very mild pneumonia.  There is no evidence of
pleural effusion or pneumothorax.

The heart is normal in size; the mediastinal contour is within
normal limits.  No acute osseous abnormalities are seen.
IMPRESSION: Mild vague left midlung zone opacity could reflect very mild
pneumonia.

## 2012-12-10 ENCOUNTER — Telehealth: Payer: Self-pay | Admitting: Internal Medicine

## 2012-12-10 DIAGNOSIS — N390 Urinary tract infection, site not specified: Secondary | ICD-10-CM

## 2012-12-10 NOTE — Telephone Encounter (Signed)
Patient Information:  Caller Name: Tyla  Phone: [phone]  Patient: Brooke Phillips, Brooke Phillips  Gender: Female  DOB: 09/06/1960  Age: 52 Years  PCP: Willow Ora  Pregnant: No  Office Follow Up:  Does the office need to follow up with this patient?: Yes  Instructions For The Office: PLEASE F/U WITH THE PT CONCERNING THE URINE LAB WORK.  THANK YOU.   Symptoms  Reason For Call & Symptoms: Pt states she has a UTI, symptoms burning with urination, fould smelling urine.  Reviewed Health History In EMR: Yes  Reviewed Medications In EMR: Yes  Reviewed Allergies In EMR: Yes  Reviewed Surgeries / Procedures: Yes  Date of Onset of Symptoms: 11/26/2012 OB / GYN:  LMP: Unknown  Guideline(s) Used:  Urination Pain - Female  Disposition Per Guideline:   See Today in Office  Reason For Disposition Reached:   > 2 UTIs in last year  Advice Given:  Call Back If:  You become worse.  RN Overrode Recommendation:  Patient Requests Prescription  Pt reports she just started a new job and can not take time off at this time to come for office visit but would be glad to drop off urine sample.

## 2012-12-10 NOTE — Telephone Encounter (Signed)
Please advise 

## 2012-12-10 NOTE — Telephone Encounter (Signed)
Ok to drop the sample  Get a UA UCX-- dx UTI Once we have a sample, call macrobid 100 mg 1 po bid #10, no RF

## 2012-12-11 ENCOUNTER — Other Ambulatory Visit: Payer: 59

## 2012-12-11 NOTE — Telephone Encounter (Signed)
Left detailed msg on pt's vmail making pt aware.  

## 2013-02-17 ENCOUNTER — Other Ambulatory Visit: Payer: Self-pay | Admitting: Internal Medicine

## 2013-02-17 NOTE — Telephone Encounter (Signed)
Ok to refill? Last OV 7.30.13 Last filled 3.14.14

## 2013-03-09 ENCOUNTER — Other Ambulatory Visit: Payer: Self-pay | Admitting: Internal Medicine

## 2013-03-09 DIAGNOSIS — Z1231 Encounter for screening mammogram for malignant neoplasm of breast: Secondary | ICD-10-CM

## 2013-03-15 ENCOUNTER — Ambulatory Visit (HOSPITAL_BASED_OUTPATIENT_CLINIC_OR_DEPARTMENT_OTHER)
Admission: RE | Admit: 2013-03-15 | Discharge: 2013-03-15 | Disposition: A | Discharge status: Home / Self Care | Payer: 59 | Source: Ambulatory Visit | Attending: Internal Medicine | Admitting: Internal Medicine

## 2013-03-15 DIAGNOSIS — Z1231 Encounter for screening mammogram for malignant neoplasm of breast: Secondary | ICD-10-CM | POA: Insufficient documentation

## 2013-04-21 ENCOUNTER — Other Ambulatory Visit: Payer: Self-pay | Admitting: Internal Medicine

## 2013-04-21 NOTE — Telephone Encounter (Signed)
Med filled pt needs an OV before anymore refills.

## 2013-07-02 ENCOUNTER — Telehealth: Payer: Self-pay | Admitting: *Deleted

## 2013-07-02 ENCOUNTER — Other Ambulatory Visit: Payer: Self-pay | Admitting: Internal Medicine

## 2013-07-02 NOTE — Telephone Encounter (Signed)
Patient is requesting a refill for acyclovir. Her last OV was 03/17/2012. Please advise.

## 2013-07-02 NOTE — Telephone Encounter (Signed)
I think we already did ; also advise patient, she is due for a checkup

## 2013-07-02 NOTE — Telephone Encounter (Signed)
Called and made patient aware. 

## 2013-07-06 ENCOUNTER — Telehealth: Payer: Self-pay | Admitting: *Deleted

## 2013-07-06 MED ORDER — ALPRAZOLAM 0.5 MG PO TABS: ORAL_TABLET | ORAL | Status: DC

## 2013-07-06 NOTE — Addendum Note (Signed)
Addended by: Willow Ora E on: 07/06/2013 01:10 PM   Modules accepted: Orders

## 2013-07-06 NOTE — Telephone Encounter (Signed)
I'm rx #21, 1 week supply; advise pt: If will be Rx her meds, she needs a ROV

## 2013-07-06 NOTE — Telephone Encounter (Signed)
Patient called and left message on triage phone stating that Dr. Drue Novel referred her to someone else for her xanax medication. Patient states that she is unable to get her refill from that provider due to him being out of the office. Patient states she hasn't slept in a couple of nights and would like to know if Dr. Drue Novel would refill her medication for her. Returned call to patient to see which provider is now prescribing the medication.  Last seen-03/17/2012  Last filled-02/17/2013  UDS-not on file, contract not signed  Please advise SW

## 2013-07-07 ENCOUNTER — Encounter: Payer: Self-pay | Admitting: *Deleted

## 2013-07-16 ENCOUNTER — Other Ambulatory Visit: Payer: Self-pay | Admitting: Internal Medicine

## 2013-07-16 NOTE — Telephone Encounter (Signed)
Pantoprazole refilled per protocol. OV due

## 2013-08-02 ENCOUNTER — Telehealth: Payer: Self-pay | Admitting: *Deleted

## 2013-08-02 NOTE — Telephone Encounter (Signed)
Patient requesting a refill for Xanax. Returned phone call to patient stating that an OV would need to be schedule before any refills will be given.  Last seen-03/17/2012  Last filled-07/06/2013  UDS-not on file, contracted not signed   Please advise. SW

## 2013-08-02 NOTE — Telephone Encounter (Signed)
Agree, needs office visit , UDS and a contract. unable to fill the prescription

## 2013-08-03 NOTE — Telephone Encounter (Signed)
Done. DJR  

## 2013-08-03 NOTE — Telephone Encounter (Signed)
Patient returning missed call. 

## 2013-08-06 ENCOUNTER — Ambulatory Visit: Payer: 59 | Admitting: Internal Medicine

## 2013-11-04 ENCOUNTER — Ambulatory Visit (INDEPENDENT_AMBULATORY_CARE_PROVIDER_SITE_OTHER): Payer: 59 | Admitting: Nurse Practitioner

## 2013-11-04 ENCOUNTER — Encounter: Payer: Self-pay | Admitting: Nurse Practitioner

## 2013-11-04 VITALS — BP 123/76 | HR 62 | Temp 98.3°F | Wt 155.6 lb

## 2013-11-04 DIAGNOSIS — R197 Diarrhea, unspecified: Secondary | ICD-10-CM

## 2013-11-04 DIAGNOSIS — R10812 Left upper quadrant abdominal tenderness: Secondary | ICD-10-CM

## 2013-11-04 DIAGNOSIS — R109 Unspecified abdominal pain: Secondary | ICD-10-CM

## 2013-11-04 NOTE — Patient Instructions (Addendum)
My office will call you with lab & ultrasound results. Take tylenol for pain 1000 mg 3 times daily if needed. No NSAIDS-advil, aleve, ibuprophen, motrin. No alcoholic beverages. Eat soft bland diet-scrambled egg, soft fruits, potatoes, rice, apple sauce. No apple juice as it makes diarrhea worse. Eat yogurt daily. Continue protonix. Call us if pain gets worse or you develop fever.

## 2013-11-04 NOTE — Progress Notes (Signed)
Subjective:     Brooke Phillips is a 53 y.o. female who presents for evaluation of diarrhea X 3days, abdominal bloating X 5 days, intermittent nausea, R flank pain.  Diarrhea occurs every time she eats.  Patient describes diarrhea as semisolid. She has taken antacids & gasx with some improvement. She has been taking 800 mg ibuprophen BID for at least 1 week for back pain. She was treated for UTI 3/7 with 7 days cipro at an UC. All UTI symptoms have resolved. R flank pain is getting worse. Patient denies blood in stool, fever, significant abdominal pain. She has appointment w/urologist in 4 days for recurrent UTI.  The following portions of the patient's history were reviewed and updated as appropriate: allergies, current medications, past medical history, past social history, past surgical history and problem list.  Review of Systems Pertinent items are noted in HPI.    Objective:    BP 123/76  Pulse 62  Temp(Src) 98.3 F (36.8 C) (Oral)  Wt 155 lb 9.6 oz (70.58 kg)  SpO2 99% General: alert, cooperative, appears stated age and mild distress  Hydration:  well hydrated  Abdomen:   Muscskeltal:    normal findings: no bruits heard, no masses palpable, no organomegaly, no renal abnormalities palpable, spleen non-palpable and symmetric and abnormal findings:  mild RUQ tenderness R sided CVA tenderness, o/w no palpable reproduction of back pain    Assessment:   Diarrhea, bloating, r flank pain. Likely due to recent ABX use Recent TX for UTI POc ua: neg leuks, blood, nites DD: GB disease, pyeloneph, NSAID induced gastritis  Plan:   Labs today, abd US Stop NSAIDS. Continue protonix. Take tylenol for pain. See pt instructions.

## 2013-11-04 NOTE — Progress Notes (Signed)
Pre visit review using our clinic review tool, if applicable. No additional management support is needed unless otherwise documented below in the visit note. 

## 2013-11-05 ENCOUNTER — Ambulatory Visit (HOSPITAL_BASED_OUTPATIENT_CLINIC_OR_DEPARTMENT_OTHER): Payer: 59

## 2013-11-05 ENCOUNTER — Telehealth: Payer: Self-pay | Admitting: Nurse Practitioner

## 2013-11-05 LAB — URINE CULTURE
Colony Count: NO GROWTH
ORGANISM ID, BACTERIA: NO GROWTH

## 2013-11-05 LAB — CBC
HCT: 39.2 % (ref 36.0–46.0)
HEMOGLOBIN: 13 g/dL (ref 12.0–15.0)
MCHC: 33.2 g/dL (ref 30.0–36.0)
MCV: 99.1 fl (ref 78.0–100.0)
PLATELETS: 193 10*3/uL (ref 150.0–400.0)
RBC: 3.95 Mil/uL (ref 3.87–5.11)
RDW: 12.5 % (ref 11.5–14.6)
WBC: 4.5 10*3/uL (ref 4.5–10.5)

## 2013-11-05 LAB — COMPREHENSIVE METABOLIC PANEL
ALBUMIN: 4.2 g/dL (ref 3.5–5.2)
ALT: 17 U/L (ref 0–35)
AST: 21 U/L (ref 0–37)
Alkaline Phosphatase: 57 U/L (ref 39–117)
BUN: 29 mg/dL — ABNORMAL HIGH (ref 6–23)
CHLORIDE: 100 meq/L (ref 96–112)
CO2: 30 mEq/L (ref 19–32)
Calcium: 9.7 mg/dL (ref 8.4–10.5)
Creatinine, Ser: 1.5 mg/dL — ABNORMAL HIGH (ref 0.4–1.2)
GFR: 40.27 mL/min — ABNORMAL LOW (ref >60.00)
GLUCOSE: 84 mg/dL (ref 70–99)
POTASSIUM: 4.5 meq/L (ref 3.5–5.1)
Sodium: 138 mEq/L (ref 135–145)
Total Bilirubin: 0.7 mg/dL (ref 0.3–1.2)
Total Protein: 7.5 g/dL (ref 6.0–8.3)

## 2013-11-05 LAB — AMYLASE: AMYLASE: 71 U/L (ref 27–131)

## 2013-11-05 LAB — LIPASE: Lipase: 26 U/L (ref 11.0–59.0)

## 2013-11-05 NOTE — Telephone Encounter (Signed)
BUN & Creat mildly elevated, but significantly changed from previous tests GFR in 40's-significant decline from prev tests.  Pt was advised to stop NSAIDS. She has appt. W/urology in 2 days. LM to discuss.

## 2013-11-08 ENCOUNTER — Ambulatory Visit (HOSPITAL_BASED_OUTPATIENT_CLINIC_OR_DEPARTMENT_OTHER): Payer: 59 | Attending: Nurse Practitioner

## 2013-11-08 ENCOUNTER — Telehealth: Payer: Self-pay | Admitting: Internal Medicine

## 2013-11-08 NOTE — Telephone Encounter (Signed)
Patient called and stated that she received a phone call on Friday about her lab results and was returning the phone call. Please advise.

## 2013-11-08 NOTE — Telephone Encounter (Signed)
Patient notified of results. Pt stated that she did not keep appt with urology. Pt stated that she will call and reschedule missed appt.

## 2013-12-24 ENCOUNTER — Other Ambulatory Visit: Payer: Self-pay | Admitting: Internal Medicine

## 2013-12-27 NOTE — Telephone Encounter (Signed)
Refill for pantoprazole refused because patient is overdue for OV. Attempted to call patient, but no answer. Left VM to please call back. JG//CMA

## 2014-03-11 ENCOUNTER — Other Ambulatory Visit: Payer: Self-pay | Admitting: Internal Medicine

## 2014-03-25 ENCOUNTER — Telehealth: Payer: Self-pay | Admitting: Internal Medicine

## 2014-03-25 MED ORDER — PANTOPRAZOLE SODIUM 40 MG PO TBEC: DELAYED_RELEASE_TABLET | ORAL | Status: DC

## 2014-03-25 NOTE — Telephone Encounter (Signed)
Caller name: Lupita LeashDonna  Call back number: [phone] Pharmacy: [facility] on [address]  Reason for call:  Pt wants a refill on Rx pantoprazole (PROTONIX) 40 MG tablet

## 2014-03-25 NOTE — Telephone Encounter (Signed)
rx sent

## 2014-06-20 ENCOUNTER — Encounter: Payer: Self-pay | Admitting: Nurse Practitioner

## 2014-09-17 ENCOUNTER — Other Ambulatory Visit: Payer: Self-pay | Admitting: Internal Medicine

## 2014-09-20 ENCOUNTER — Other Ambulatory Visit: Payer: Self-pay | Admitting: Internal Medicine

## 2014-09-23 ENCOUNTER — Other Ambulatory Visit: Payer: Self-pay | Admitting: Internal Medicine

## 2014-09-30 ENCOUNTER — Ambulatory Visit: Payer: Self-pay | Admitting: Internal Medicine

## 2014-09-30 ENCOUNTER — Other Ambulatory Visit: Payer: Self-pay

## 2014-10-05 ENCOUNTER — Telehealth: Payer: Self-pay

## 2014-10-05 ENCOUNTER — Ambulatory Visit: Payer: Self-pay | Admitting: Internal Medicine

## 2014-10-05 NOTE — Telephone Encounter (Signed)
Opened in error

## 2014-10-06 NOTE — Telephone Encounter (Signed)
Please start dismissal process, reason: multiple no shows

## 2014-10-07 NOTE — Telephone Encounter (Signed)
Dismissal letter printed, signed and sent to HIM

## 2014-10-12 ENCOUNTER — Telehealth: Payer: Self-pay | Admitting: Internal Medicine

## 2014-10-12 NOTE — Telephone Encounter (Signed)
Dismissal Letter sent by Certified Mail 10/12/2014

## 2014-10-25 NOTE — Telephone Encounter (Signed)
Pt called to schedule an appointment.  Pt was informed at this time that a certified letter was mailed to her informing her that she has been dismissed from the practice.  Pt states she never received a  letter and that she only missed two appointments.  Pt states "that's fine, it's not like there is a shortage of doctors in the world."  She has asked for an original copy of her records and wants her records destroyed in the system if she is no longer a patient.  Pt states per HIPPA laws she is entitled to the original copy of her files and can request that her records be destroyed.

## 2014-10-26 NOTE — Telephone Encounter (Signed)
noted 

## 2014-11-14 NOTE — Telephone Encounter (Signed)
Certified dismissal letter returned as undeliverable, unclaimed, return to sender after three attempts by [employer] on November 14, 2014.  Letter placed in another envelope and resent as 1st class mail which does not require a signature on 11/14/14. DJC

## 2014-11-14 NOTE — Telephone Encounter (Signed)
thx

## 2014-11-23 ENCOUNTER — Telehealth: Payer: Self-pay | Admitting: Internal Medicine

## 2014-11-23 NOTE — Telephone Encounter (Signed)
Letter that was resent as 1st class mail on 11/14/2014 received on 11/23/2014 with notice of forwarding address:[facility] Letter placed in another envelope and resent as 1st class mail which does not require a signature on 11/23/2014 to address:   [address] [place], [place] [phone]

## 2017-04-06 ENCOUNTER — Encounter (HOSPITAL_BASED_OUTPATIENT_CLINIC_OR_DEPARTMENT_OTHER): Payer: Self-pay | Admitting: Emergency Medicine

## 2017-04-06 ENCOUNTER — Emergency Department (HOSPITAL_BASED_OUTPATIENT_CLINIC_OR_DEPARTMENT_OTHER)
Admission: EM | Admit: 2017-04-06 | Discharge: 2017-04-06 | Disposition: A | Discharge status: Home / Self Care | Payer: BLUE CROSS/BLUE SHIELD | Attending: Emergency Medicine | Admitting: Emergency Medicine

## 2017-04-06 DIAGNOSIS — Z79899 Other long term (current) drug therapy: Secondary | ICD-10-CM | POA: Insufficient documentation

## 2017-04-06 DIAGNOSIS — G8929 Other chronic pain: Secondary | ICD-10-CM

## 2017-04-06 DIAGNOSIS — M542 Cervicalgia: Secondary | ICD-10-CM | POA: Diagnosis present

## 2017-04-06 DIAGNOSIS — Z87891 Personal history of nicotine dependence: Secondary | ICD-10-CM | POA: Diagnosis not present

## 2017-04-06 MED ORDER — HYDROCODONE-ACETAMINOPHEN 5-325 MG PO TABS: 1.0000 | ORAL_TABLET | Freq: Four times a day (QID) | ORAL | 0 refills | Status: DC | PRN

## 2017-04-06 NOTE — ED Triage Notes (Signed)
Patient reports that she has chronic neck pain - she is running out of her medications and does not think she will be able to make it to her MD tomorrow

## 2017-04-06 NOTE — ED Provider Notes (Signed)
[facility]   CSN: [id number] Arrival date & time: 04/06/17  1417     History   Chief Complaint Chief Complaint  Patient presents with  . Neck Injury    HPI Brooke Phillips is a 56 y.o. female.  HPI   56 year old female presenting for evaluation of neck pain. Patient states she has had progressive worsening bilateral neck pain worse on the right side ongoing for the past 7 months. She has been managed by her primary care doctor who recently obtained an x-ray of her neck as well as an MRI which show a large bone spur causing her pain. She is scheduled to follow-up with a neurosurgeon tomorrow at 11 AM she is here today due to having worsening pain since she has ran out of her pain medication. States that she was given as a ventilatory medication muscle relaxant in the past, which escalates to tramadol, and now she is taking hydrocodone. She only have one pill left. Her pain is moderate to severe, sharp, shooting down her right side of her back and worse with movement. No associated fever, no arm weakness, and no rash. She did admits that she took one extra pain medication yesterday but she normally tries to stay compliant with the medication regimen that her doctor had prescribed.  Past Medical History:  Diagnosis Date  . Anemia    NOS  . Depression with anxiety 2008   started when husband dx w/  colon ca  . Gastric ulcer     Patient Active Problem List   Diagnosis Date Noted  . History of gastric ulcer 08/06/2012  . History of vitamin D deficiency 08/06/2012  . Hyperlipidemia 08/06/2012  . Elbow pain 03/17/2012  . Allergic rhinitis 04/19/2011  . Depression with anxiety   . DYSPEPSIA 06/29/2010  . WEIGHT GAIN 06/29/2010  . DYSPHAGIA UNSPECIFIED 10/05/2008  . ANEMIA-NOS 03/02/2007  . PAP SMEAR, ABNORMAL 03/02/2007    Past Surgical History:  Procedure Laterality Date  . HERNIA REPAIR    . TUBAL LIGATION      OB History    Gravida Para Term  Preterm AB Living   2 2       2    SAB TAB Ectopic Multiple Live Births                   Home Medications    Prior to Admission medications   Medication Sig Start Date End Date Taking? Authorizing Provider  acyclovir (ZOVIRAX) 800 MG tablet TAKE 1 TABLET BY MOUTH 3 TIMES A DAY AS NEEDED FOR FEVER BLISTERS FOR 5 DAYS PER EPISODE 03/11/14   Paz, Nolon Rod, MD  ALPRAZolam Prudy Feeler) 0.5 MG tablet TAKE 3 TABLETS AT BEDTIME 07/06/13   Wanda Plump, MD  buPROPion (WELLBUTRIN XL) 150 MG 24 hr tablet Take 75 mg by mouth daily.     [provider]  citalopram (CELEXA) 20 MG tablet Take 20 mg by mouth daily.  11/19/10   Wanda Plump, MD  diazepam (VALIUM) 10 MG tablet  03/20/12   [provider]  LO LOESTRIN FE 1 MG-10 MCG / 10 MCG tablet  12/27/11   [provider]  Multiple Vitamins-Minerals ([facility] DAILY GUMMIES PO) Take 1 tablet by mouth daily.      [provider]  pantoprazole (PROTONIX) 40 MG tablet TAKE 1 TABLET BY MOUTH EVERY DAY. NEED APPOINTMENT @ DR.PAZ for additional refills. 03/25/14   Wanda Plump, MD  psyllium (METAMUCIL)  58.6 % powder Take 1 packet by mouth daily.      [provider]    Family History Family History  Problem Relation Age of Onset  . Esophageal cancer Father   . Sudden death Mother   . Breast cancer Neg Hx   . Diabetes Neg Hx   . Heart attack Neg Hx   . Hyperlipidemia Neg Hx   . Hypertension Neg Hx     Social History Social History  Substance Use Topics  . Smoking status: Former Games developer  . Smokeless tobacco: Never Used     Comment: >30 years ago  . Alcohol use Yes     Comment: occasionally     Allergies   Patient has no known allergies.   Review of Systems Review of Systems  Constitutional: Negative for fever.  Musculoskeletal: Positive for neck pain and neck stiffness.  Skin: Negative for rash and wound.  Neurological: Negative for headaches.     Physical Exam Updated Vital Signs BP (!) 157/93 (BP Location:  Left Arm)   Pulse 71   Temp 98.8 F (37.1 C)   Resp 18   Ht 5' 6.5" (1.689 m)   Wt 69.4 kg (153 lb)   SpO2 100%   BMI 24.32 kg/m   Physical Exam  Constitutional: She is oriented to person, place, and time. She appears well-developed and well-nourished. No distress.  Patient is well-appearing in no acute discomfort.  HENT:  Head: Normocephalic and atraumatic.  Eyes: Conjunctivae are normal.  Neck: Neck supple.  Decrease lateral neck movement, flexion or extension secondary to pain. Tenderness along the cervical spine and paraspinal muscle on palpation without any overlying skin changes, no crepitus, no step-off.  Cardiovascular: Normal rate and regular rhythm.   Pulmonary/Chest: Effort normal and breath sounds normal.  Musculoskeletal:  5 out 5 strength to bilateral upper extremities with normal grip strength and intact radial pulses.  Neurological: She is alert and oriented to person, place, and time.  Skin: No rash noted.  Psychiatric: She has a normal mood and affect.  Nursing note and vitals reviewed.    ED Treatments / Results  Labs (all labs ordered are listed, but only abnormal results are displayed) Labs Reviewed - No data to display  EKG  EKG Interpretation None       Radiology No results found.  Procedures Procedures (including critical care time)  Medications Ordered in ED Medications - No data to display   Initial Impression / Assessment and Plan / ED Course  I have reviewed the triage vital signs and the nursing notes.  Pertinent labs & imaging results that were available during my care of the patient were reviewed by me and considered in my medical decision making (see chart for details).     BP (!) 157/93 (BP Location: Left Arm)   Pulse 71   Temp 98.8 F (37.1 C)   Resp 18   Ht 5' 6.5" (1.689 m)   Wt 69.4 kg (153 lb)   SpO2 100%   BMI 24.32 kg/m    Final Clinical Impressions(s) / ED Diagnoses   Final diagnoses:  Neck pain,  chronic    New Prescriptions New Prescriptions   HYDROCODONE-ACETAMINOPHEN (NORCO) 5-325 MG TABLET    Take 1 tablet by mouth every 6 (six) hours as needed for moderate pain or severe pain.   3:20 PM Pt here with progressive worsening neck pain 2/2 bone spur in neck.  She has a scheduled appointment with a neurosurgeon tomorrow  at 11am.  She is here requesting help with her pain.  Pt will receive 4 hydrocodone for pain control until she can be seen by her neurosurgeon.  In order to decrease risk of narcotic abuse. Pt's record were checked using the [place] Controlled Substance database.     Fayrene Helper, PA-C 04/06/17 1523    Mesner, Barbara Cower, MD 04/06/17 [phone]

## 2017-04-06 NOTE — Discharge Instructions (Signed)
Please follow up closely with your neurosurgeon tomorrow as scheduled for further management of your neck pain.

## 2017-06-24 DIAGNOSIS — Z01810 Encounter for preprocedural cardiovascular examination: Secondary | ICD-10-CM | POA: Diagnosis not present

## 2017-06-24 DIAGNOSIS — R001 Bradycardia, unspecified: Secondary | ICD-10-CM | POA: Diagnosis not present

## 2017-06-25 DIAGNOSIS — Z85828 Personal history of other malignant neoplasm of skin: Secondary | ICD-10-CM | POA: Diagnosis not present

## 2017-06-25 DIAGNOSIS — L700 Acne vulgaris: Secondary | ICD-10-CM | POA: Diagnosis not present

## 2017-06-25 DIAGNOSIS — Z08 Encounter for follow-up examination after completed treatment for malignant neoplasm: Secondary | ICD-10-CM | POA: Diagnosis not present

## 2017-07-01 DIAGNOSIS — M50123 Cervical disc disorder at C6-C7 level with radiculopathy: Secondary | ICD-10-CM | POA: Diagnosis not present

## 2017-07-01 DIAGNOSIS — M50023 Cervical disc disorder at C6-C7 level with myelopathy: Secondary | ICD-10-CM | POA: Diagnosis not present

## 2017-07-01 DIAGNOSIS — M4722 Other spondylosis with radiculopathy, cervical region: Secondary | ICD-10-CM | POA: Diagnosis not present

## 2017-07-02 DIAGNOSIS — M4722 Other spondylosis with radiculopathy, cervical region: Secondary | ICD-10-CM | POA: Diagnosis not present

## 2017-07-02 DIAGNOSIS — M50023 Cervical disc disorder at C6-C7 level with myelopathy: Secondary | ICD-10-CM | POA: Diagnosis not present

## 2017-07-02 DIAGNOSIS — M4322 Fusion of spine, cervical region: Secondary | ICD-10-CM | POA: Diagnosis not present

## 2017-08-01 DIAGNOSIS — M542 Cervicalgia: Secondary | ICD-10-CM | POA: Diagnosis not present

## 2017-08-06 DIAGNOSIS — Z1231 Encounter for screening mammogram for malignant neoplasm of breast: Secondary | ICD-10-CM | POA: Diagnosis not present

## 2017-08-06 DIAGNOSIS — Z6826 Body mass index (BMI) 26.0-26.9, adult: Secondary | ICD-10-CM | POA: Diagnosis not present

## 2017-08-06 DIAGNOSIS — Z1151 Encounter for screening for human papillomavirus (HPV): Secondary | ICD-10-CM | POA: Diagnosis not present

## 2017-08-06 DIAGNOSIS — B977 Papillomavirus as the cause of diseases classified elsewhere: Secondary | ICD-10-CM | POA: Diagnosis not present

## 2017-08-06 DIAGNOSIS — R300 Dysuria: Secondary | ICD-10-CM | POA: Diagnosis not present

## 2017-08-06 DIAGNOSIS — Z01419 Encounter for gynecological examination (general) (routine) without abnormal findings: Secondary | ICD-10-CM | POA: Diagnosis not present

## 2017-08-19 HISTORY — PX: OTHER SURGICAL HISTORY: SHX169

## 2017-08-28 DIAGNOSIS — R87613 High grade squamous intraepithelial lesion on cytologic smear of cervix (HGSIL): Secondary | ICD-10-CM | POA: Diagnosis not present

## 2017-08-28 DIAGNOSIS — B977 Papillomavirus as the cause of diseases classified elsewhere: Secondary | ICD-10-CM | POA: Diagnosis not present

## 2017-09-12 DIAGNOSIS — B977 Papillomavirus as the cause of diseases classified elsewhere: Secondary | ICD-10-CM | POA: Diagnosis not present

## 2017-09-15 DIAGNOSIS — Z85828 Personal history of other malignant neoplasm of skin: Secondary | ICD-10-CM | POA: Diagnosis not present

## 2017-09-15 DIAGNOSIS — L700 Acne vulgaris: Secondary | ICD-10-CM | POA: Diagnosis not present

## 2017-09-25 DIAGNOSIS — M4322 Fusion of spine, cervical region: Secondary | ICD-10-CM | POA: Diagnosis not present

## 2017-09-26 DIAGNOSIS — M50023 Cervical disc disorder at C6-C7 level with myelopathy: Secondary | ICD-10-CM | POA: Diagnosis not present

## 2017-09-26 DIAGNOSIS — F3340 Major depressive disorder, recurrent, in remission, unspecified: Secondary | ICD-10-CM | POA: Diagnosis not present

## 2017-09-26 DIAGNOSIS — C44300 Unspecified malignant neoplasm of skin of unspecified part of face: Secondary | ICD-10-CM | POA: Diagnosis not present

## 2017-09-26 DIAGNOSIS — F5101 Primary insomnia: Secondary | ICD-10-CM | POA: Diagnosis not present

## 2017-10-27 DIAGNOSIS — L700 Acne vulgaris: Secondary | ICD-10-CM | POA: Diagnosis not present

## 2017-11-05 DIAGNOSIS — F3181 Bipolar II disorder: Secondary | ICD-10-CM | POA: Diagnosis not present

## 2017-11-12 DIAGNOSIS — L738 Other specified follicular disorders: Secondary | ICD-10-CM | POA: Diagnosis not present

## 2017-11-12 DIAGNOSIS — L700 Acne vulgaris: Secondary | ICD-10-CM | POA: Diagnosis not present

## 2017-12-03 DIAGNOSIS — F3181 Bipolar II disorder: Secondary | ICD-10-CM | POA: Diagnosis not present

## 2017-12-25 DIAGNOSIS — M4322 Fusion of spine, cervical region: Secondary | ICD-10-CM | POA: Diagnosis not present

## 2017-12-25 DIAGNOSIS — M542 Cervicalgia: Secondary | ICD-10-CM | POA: Diagnosis not present

## 2017-12-25 DIAGNOSIS — M5412 Radiculopathy, cervical region: Secondary | ICD-10-CM | POA: Diagnosis not present

## 2018-01-01 DIAGNOSIS — F3181 Bipolar II disorder: Secondary | ICD-10-CM | POA: Diagnosis not present

## 2018-01-13 DIAGNOSIS — L700 Acne vulgaris: Secondary | ICD-10-CM | POA: Diagnosis not present

## 2018-02-12 DIAGNOSIS — R1013 Epigastric pain: Secondary | ICD-10-CM | POA: Diagnosis not present

## 2018-02-12 DIAGNOSIS — F3340 Major depressive disorder, recurrent, in remission, unspecified: Secondary | ICD-10-CM | POA: Diagnosis not present

## 2018-02-12 DIAGNOSIS — M25549 Pain in joints of unspecified hand: Secondary | ICD-10-CM | POA: Diagnosis not present

## 2018-02-12 DIAGNOSIS — Z8711 Personal history of peptic ulcer disease: Secondary | ICD-10-CM | POA: Diagnosis not present

## 2018-02-13 ENCOUNTER — Encounter: Payer: Self-pay | Admitting: Gastroenterology

## 2018-02-13 DIAGNOSIS — L700 Acne vulgaris: Secondary | ICD-10-CM | POA: Diagnosis not present

## 2018-02-27 ENCOUNTER — Ambulatory Visit: Payer: BLUE CROSS/BLUE SHIELD | Admitting: Physician Assistant

## 2018-02-27 ENCOUNTER — Encounter: Payer: Self-pay | Admitting: Physician Assistant

## 2018-02-27 VITALS — BP 110/70 | HR 68 | Ht 67.0 in | Wt 166.0 lb

## 2018-02-27 DIAGNOSIS — Z8711 Personal history of peptic ulcer disease: Secondary | ICD-10-CM

## 2018-02-27 DIAGNOSIS — Z8719 Personal history of other diseases of the digestive system: Secondary | ICD-10-CM

## 2018-02-27 DIAGNOSIS — Z1212 Encounter for screening for malignant neoplasm of rectum: Secondary | ICD-10-CM | POA: Diagnosis not present

## 2018-02-27 DIAGNOSIS — Z1211 Encounter for screening for malignant neoplasm of colon: Secondary | ICD-10-CM

## 2018-02-27 DIAGNOSIS — R1013 Epigastric pain: Secondary | ICD-10-CM

## 2018-02-27 MED ORDER — RANITIDINE HCL 150 MG PO TABS: 150.0000 mg | ORAL_TABLET | Freq: Two times a day (BID) | ORAL | 2 refills | Status: DC

## 2018-02-27 NOTE — Progress Notes (Signed)
Chief Complaint: Epigastric pain  HPI:    Brooke Phillips is a 57 year old female with a past medical history as listed below, who previously followed with Dr. Arlyce Dice for colonoscopies and who was referred to me by Zoila Shutter, MD for a complaint of epigastric pain.      07/07/2007 EGD with finding of an ulcer in the antrum, otherwise normal.    10/20/2008 colonoscopy for iron deficiency anemia was normal.    02/12/2018 saw PCP and discussed her history of ulcers and wanted a referral to our clinic.     Today, explains that she had a gastric ulcer diagnosis back in her 18s and then again at time of her last EGD in 2008, feels similar to that now.  Describing an epigastric discomfort rated as a 1-2/10 sometimes worse with eating over the past year or so, also bloating after eating anything.  Recently started on empiric treatment for H. pylori by her PCP.  She is currently on 2 antibiotics and her Pantoprazole was increased to 40 mg twice daily, she has been on this for a week with no change in symptoms.    Denies fever, chills, melena, blood in her stool, nausea, vomiting or reflux.  Past Medical History:  Diagnosis Date  . Anemia    NOS  . Depression with anxiety 2008   started when husband dx w/  colon ca  . Gastric ulcer     Past Surgical History:  Procedure Laterality Date  . HERNIA REPAIR    . TUBAL LIGATION      Current Outpatient Medications  Medication Sig Dispense Refill  . acyclovir (ZOVIRAX) 800 MG tablet TAKE 1 TABLET BY MOUTH 3 TIMES A DAY AS NEEDED FOR FEVER BLISTERS FOR 5 DAYS PER EPISODE 60 tablet 0  . ALPRAZolam (XANAX) 0.5 MG tablet TAKE 3 TABLETS AT BEDTIME 21 tablet 0  . buPROPion (WELLBUTRIN XL) 150 MG 24 hr tablet Take 75 mg by mouth daily.     . citalopram (CELEXA) 20 MG tablet Take 20 mg by mouth daily.     . diazepam (VALIUM) 10 MG tablet     . HYDROcodone-acetaminophen (NORCO) 5-325 MG tablet Take 1 tablet by mouth every 6 (six) hours as needed for moderate  pain or severe pain. 4 tablet 0  . LO LOESTRIN FE 1 MG-10 MCG / 10 MCG tablet     . Multiple Vitamins-Minerals ([facility] DAILY GUMMIES PO) Take 1 tablet by mouth daily.      . pantoprazole (PROTONIX) 40 MG tablet TAKE 1 TABLET BY MOUTH EVERY DAY. NEED APPOINTMENT @ DR.PAZ for additional refills. 30 tablet 0  . psyllium (METAMUCIL) 58.6 % powder Take 1 packet by mouth daily.       No current facility-administered medications for this visit.     Allergies as of 02/27/2018  . (No Known Allergies)    Family History  Problem Relation Age of Onset  . Esophageal cancer Father   . Sudden death Mother   . Breast cancer Neg Hx   . Diabetes Neg Hx   . Heart attack Neg Hx   . Hyperlipidemia Neg Hx   . Hypertension Neg Hx     Social History   Socioeconomic History  . Marital status: Widowed    Spouse name: Not on file  . Number of children: 2  . Years of education: Not on file  . Highest education level: Not on file  Occupational History  . Occupation: Production designer, theatre/television/film  Employer: [employer]  Social Needs  . Financial resource strain: Not on file  . Food insecurity:    Worry: Not on file    Inability: Not on file  . Transportation needs:    Medical: Not on file    Non-medical: Not on file  Tobacco Use  . Smoking status: Former Games developermoker  . Smokeless tobacco: Never Used  . Tobacco comment: >30 years ago  Substance and Sexual Activity  . Alcohol use: Yes    Comment: occasionally  . Drug use: No  . Sexual activity: Never  Lifestyle  . Physical activity:    Days per week: Not on file    Minutes per session: Not on file  . Stress: Not on file  Relationships  . Social connections:    Talks on phone: Not on file    Gets together: Not on file    Attends religious service: Not on file    Active member of club or organization: Not on file    Attends meetings of clubs or organizations: Not on file    Relationship status: Not on file  . Intimate partner violence:    Fear of current  or ex partner: Not on file    Emotionally abused: Not on file    Physically abused: Not on file    Forced sexual activity: Not on file  Other Topics Concern  . 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      Last Colonoscopy: 10/19/2008 [facility]      Last Mammogram: 06/15/2009 @ [facility] Assessment: Negative - BI RADS 1^MM Digital Screening             Review of Systems:    Constitutional: No weight loss, fever or chills Skin: No rash  Cardiovascular: No chest pain Respiratory: No SOB  Gastrointestinal: See HPI and otherwise negative Genitourinary: No dysuria  Neurological: No headache Musculoskeletal: No new muscle or joint pain Hematologic: No bleeding Psychiatric: No history of depression or anxiety   Physical Exam:  Vital signs: .BP 110/70   Pulse 68   Ht 5\' 7"  (1.702 m)   Wt 166 lb (75.3 kg)   BMI 26.00 kg/m   Constitutional:   Pleasant Caucasian female appears to be in NAD, Well developed, Well nourished, alert and cooperative Head:  Normocephalic and atraumatic. Eyes:   PEERL, EOMI. No icterus. Conjunctiva pink. Ears:  Normal auditory acuity. Neck:  Supple Throat: Oral cavity and pharynx without inflammation, swelling or lesion.  Respiratory: Respirations even and unlabored. Lungs clear to auscultation bilaterally.   No wheezes, crackles, or rhonchi.  Cardiovascular: Normal S1, S2. No MRG. Regular rate and rhythm. No peripheral edema, cyanosis or pallor.  Gastrointestinal:  Soft, nondistended, mild epigastric ttp No rebound or guarding. Normal bowel sounds. No appreciable masses or hepatomegaly. Rectal:  Not performed.  Msk:  Symmetrical without gross deformities. Without edema, no deformity or joint abnormality.    Neurologic:  Alert and  oriented x4;  grossly normal neurologically.  Skin:   Dry and intact without significant lesions or rashes. Psychiatric: Demonstrates good judgement and reason without abnormal affect or behaviors.  No recent labs or imaging.  Assessment: 1.  Epigastric pain: Over the past year, worse recently rated as a  2/10, no improvement after 1 week of empiric treatment for suspected H. pylori including increase in pantoprazole 40 mg twice daily; consider GERD/gastritis/PUD plus/minus H. pylori versus functional dyspepsia 2.  H/o Gastric ulcer: In her 53s as well as in 2008 3.  Screening for colorectal cancer: Last colonoscopy in 2010, repeat in 10 years, will be due in Oct 2020  Plan: 1.  Scheduled EGD for the patient in 3-4 weeks with Dr. Myrtie Neither, as he is supervising this afternoon, in the [facility].  Did discuss risk, benefits, limitations and alternatives.  Discussed that I would like to wait until she finishes treatment for H. pylori before pursuing this.  Patient verbalized understanding. 2.  Patient to continue treatment for H. pylori for now.  Then continue on Pantoprazole 40 mg twice daily. 3.  Added Zantac 150 mg twice daily, every morning and nightly #60 with 2 refills 4.  Reviewed antireflux diet and lifestyle modifications. 5.  Patient will be due for screening colonoscopy next year. 6.  Patient to follow in clinic per recommendations from Dr. Myrtie Neither after time of procedure.  Hyacinth Meeker, PA-C [facility] 02/27/2018, 2:57 PM  Cc: Zoila Shutter, MD

## 2018-02-27 NOTE — Patient Instructions (Signed)
If you are age 56 or older, your body mass index should be between 23-30. Your Body mass index is 26 kg/m. If this is out of the aforementioned range listed, please consider follow up with your Primary Care Provider.  If you are age 56 or younger, your body mass index should be between 19-25. Your Body mass index is 26 kg/m. If this is out of the aformentioned range listed, please consider follow up with your Primary Care Provider.   You have been scheduled for an endoscopy. Please follow written instructions given to you at your visit today. If you use inhalers (even only as needed), please bring them with you on the day of your procedure. Your physician has requested that you go to www.startemmi.com and enter the access code given to you at your visit today. This web site gives a general overview about your procedure. However, you should still follow specific instructions given to you by our office regarding your preparation for the procedure.  We have sent the following medications to your pharmacy for you to pick up at your convenience: Zantac 150 mg  Thank you for choosing me and [facility].   Hyacinth MeekerJennifer Lemmon, PA-C

## 2018-03-04 NOTE — Progress Notes (Signed)
Thank you for sending this case to me. I have reviewed the entire note, and the outlined plan seems appropriate.  Not clear to me what H pylori testing was done prior to recent treatment. Will Bx stomach for H pylori during EGD.   Amada JupiterHenry Danis, MD

## 2018-03-05 DIAGNOSIS — F3181 Bipolar II disorder: Secondary | ICD-10-CM | POA: Diagnosis not present

## 2018-03-16 DIAGNOSIS — L700 Acne vulgaris: Secondary | ICD-10-CM | POA: Diagnosis not present

## 2018-04-07 ENCOUNTER — Encounter: Payer: Self-pay | Admitting: Gastroenterology

## 2018-04-07 ENCOUNTER — Ambulatory Visit (AMBULATORY_SURGERY_CENTER): Payer: BLUE CROSS/BLUE SHIELD | Admitting: Gastroenterology

## 2018-04-07 VITALS — BP 171/72 | HR 60 | Temp 98.6°F | Resp 26 | Ht 67.0 in | Wt 166.0 lb

## 2018-04-07 DIAGNOSIS — K3189 Other diseases of stomach and duodenum: Secondary | ICD-10-CM | POA: Diagnosis not present

## 2018-04-07 DIAGNOSIS — R1013 Epigastric pain: Secondary | ICD-10-CM | POA: Diagnosis not present

## 2018-04-07 DIAGNOSIS — R1310 Dysphagia, unspecified: Secondary | ICD-10-CM | POA: Diagnosis not present

## 2018-04-07 DIAGNOSIS — Z8619 Personal history of other infectious and parasitic diseases: Secondary | ICD-10-CM | POA: Diagnosis not present

## 2018-04-07 MED ORDER — SODIUM CHLORIDE 0.9 % IV SOLN
500.0000 mL | Freq: Once | INTRAVENOUS | Status: DC
Start: 2018-04-07 — End: 2018-04-07

## 2018-04-07 NOTE — Progress Notes (Signed)
Called to room to assist during endoscopic procedure.  Patient ID and intended procedure confirmed with present staff. Received instructions for my participation in the procedure from the performing physician.  

## 2018-04-07 NOTE — Progress Notes (Signed)
Pt's states no medical or surgical changes since previsit or office visit. 

## 2018-04-07 NOTE — Op Note (Signed)
[facility] Patient Name: Brooke GreaserDonna Mcqueary Procedure Date: 04/07/2018 3:35 PM MRN: [id number] Endoscopist: Sherilyn CooterHenry L. Myrtie Neitheranis , MD Age: [age] Referring MD:  Date of Birth: 08/01/1961 Gender: Female Account #: [id number] Procedure:                Upper GI endoscopy Indications:              Epigastric abdominal pain, Previously treated for                            Helicobacter pylori Medicines:                Monitored Anesthesia Care Procedure:                Pre-Anesthesia Assessment:                           - Prior to the procedure, a History and Physical                            was performed, and patient medications and                            allergies were reviewed. The patient's tolerance of                            previous anesthesia was also reviewed. The risks                            and benefits of the procedure and the sedation                            options and risks were discussed with the patient.                            All questions were answered, and informed consent                            was obtained. Prior Anticoagulants: The patient has                            taken no previous anticoagulant or antiplatelet                            agents. ASA Grade Assessment: II - A patient with                            mild systemic disease. After reviewing the risks                            and benefits, the patient was deemed in                            satisfactory condition to undergo the procedure.  After obtaining informed consent, the endoscope was                            passed under direct vision. Throughout the                            procedure, the patient's blood pressure, pulse, and                            oxygen saturations were monitored continuously. The                            Endoscope was introduced through the mouth, and                            advanced to the second part of duodenum.  The upper                            GI endoscopy was accomplished without difficulty.                            The patient tolerated the procedure well. Scope In: Scope Out: Findings:                 The esophagus was normal.                           Patchy mildly erythematous mucosa was found in the                            prepyloric region of the stomach. Biopsies were                            taken with a cold forceps for histology. (S[place]ney                            protocol).                           The cardia and gastric fundus were normal on                            retroflexion.                           The exam of the stomach was otherwise normal.                           The examined duodenum was normal. Complications:            No immediate complications. Estimated Blood Loss:     Estimated blood loss was minimal. Impression:               - Normal esophagus.                           - Erythematous  mucosa in the prepyloric region of                            the stomach. Biopsied.                           - Normal examined duodenum. Recommendation:           - Patient has a contact number available for                            emergencies. The signs and symptoms of potential                            delayed complications were discussed with the                            patient. Return to normal activities tomorrow.                            Written discharge instructions were provided to the                            patient.                           - Resume previous diet.                           - Continue present medications.                           - Await pathology results. Henry L. Myrtie Neither, MD 04/07/2018 3:51:45 PM This report has been signed electronically.

## 2018-04-07 NOTE — Progress Notes (Signed)
To PACU, vss patent aw report to rn 

## 2018-04-07 NOTE — Patient Instructions (Signed)
YOU HAD AN ENDOSCOPIC PROCEDURE TODAY AT THE [facility]:   Refer to the procedure report that was given to you for any specific questions about what was found during the examination.  If the procedure report does not answer your questions, please call your gastroenterologist to clarify.  If you requested that your care partner not be given the details of your procedure findings, then the procedure report has been included in a sealed envelope for you to review at your convenience later.  YOU SHOULD EXPECT: Some feelings of bloating in the abdomen. Passage of more gas than usual.  Walking can help get rid of the air that was put into your GI tract during the procedure and reduce the bloating. If you had a lower endoscopy (such as a colonoscopy or flexible sigmoidoscopy) you may notice spotting of blood in your stool or on the toilet paper. If you underwent a bowel prep for your procedure, you may not have a normal bowel movement for a few days.  Please Note:  You might notice some irritation and congestion in your nose or some drainage.  This is from the oxygen used during your procedure.  There is no need for concern and it should clear up in a day or so.  SYMPTOMS TO REPORT IMMEDIATELY:   Following lower endoscopy (colonoscopy or flexible sigmoidoscopy):  Excessive amounts of blood in the stool  Significant tenderness or worsening of abdominal pains  Swelling of the abdomen that is new, acute  Fever of 100F or higher   Following upper endoscopy (EGD)  Vomiting of blood or coffee ground material  New chest pain or pain under the shoulder blades  Painful or persistently difficult swallowing  New shortness of breath  Fever of 100F or higher  Black, tarry-looking stools  For urgent or emergent issues, a gastroenterologist can be reached at any hour by calling [phone].   DIET:  We do recommend a small meal at first, but then you may proceed to your regular diet.  Drink  plenty of fluids but you should avoid alcoholic beverages for 24 hours.  ACTIVITY:  You should plan to take it easy for the rest of today and you should NOT DRIVE or use heavy machinery until tomorrow (because of the sedation medicines used during the test).    FOLLOW UP: Our staff will call the number listed on your records the next business day following your procedure to check on you and address any questions or concerns that you may have regarding the information given to you following your procedure. If we do not reach you, we will leave a message.  However, if you are feeling well and you are not experiencing any problems, there is no need to return our call.  We will assume that you have returned to your regular daily activities without incident.  If any biopsies were taken you will be contacted by phone or by letter within the next 1-3 weeks.  Please call us at [phone] if you have not heard about the biopsies in 3 weeks.    SIGNATURES/CONFIDENTIALITY: You and/or your care partner have signed paperwork which will be entered into your electronic medical record.  These signatures attest to the fact that that the information above on your After Visit Summary has been reviewed and is understood.  Full responsibility of the confidentiality of this discharge information lies with you and/or your care-partner. 

## 2018-04-07 NOTE — Progress Notes (Signed)
Pt. Found to have tourniquet remaining after IV start.  States she had not noticed.  Induration noted around upper arm (right) After tourniquet removed.  Tourniquet started in admission and Stayed on until pt. Reached recovery area.  She states site is  "a little sore".

## 2018-04-08 ENCOUNTER — Telehealth: Payer: Self-pay

## 2018-04-08 NOTE — Telephone Encounter (Signed)
  Follow up Call-  Call back number 04/07/2018  Post procedure Call Back phone  # [phone]  Permission to leave phone message Yes  Some recent data might be hidden     Patient questions:  Do you have a fever, pain , or abdominal swelling? No. Pain Score  0 *  Have you tolerated food without any problems? Yes.    Have you been able to return to your normal activities? Yes.    Do you have any questions about your discharge instructions: Diet   No. Medications  No. Follow up visit  No.  Do you have questions or concerns about your Care? No.  Actions: * If pain score is 4 or above: No action needed, pain <4.

## 2018-04-16 DIAGNOSIS — L700 Acne vulgaris: Secondary | ICD-10-CM | POA: Diagnosis not present

## 2018-04-17 ENCOUNTER — Ambulatory Visit: Payer: BLUE CROSS/BLUE SHIELD | Admitting: Gastroenterology

## 2018-05-19 DIAGNOSIS — Z79899 Other long term (current) drug therapy: Secondary | ICD-10-CM | POA: Diagnosis not present

## 2018-05-19 DIAGNOSIS — L700 Acne vulgaris: Secondary | ICD-10-CM | POA: Diagnosis not present

## 2018-05-29 ENCOUNTER — Ambulatory Visit: Payer: BLUE CROSS/BLUE SHIELD | Admitting: Gastroenterology

## 2018-06-09 DIAGNOSIS — F3181 Bipolar II disorder: Secondary | ICD-10-CM | POA: Diagnosis not present

## 2018-06-18 DIAGNOSIS — L700 Acne vulgaris: Secondary | ICD-10-CM | POA: Diagnosis not present

## 2018-06-23 ENCOUNTER — Other Ambulatory Visit: Payer: Self-pay | Admitting: Physician Assistant

## 2018-06-24 DIAGNOSIS — Z79899 Other long term (current) drug therapy: Secondary | ICD-10-CM | POA: Diagnosis not present

## 2018-06-24 DIAGNOSIS — L700 Acne vulgaris: Secondary | ICD-10-CM | POA: Diagnosis not present

## 2018-07-20 DIAGNOSIS — L308 Other specified dermatitis: Secondary | ICD-10-CM | POA: Diagnosis not present

## 2018-07-20 DIAGNOSIS — L700 Acne vulgaris: Secondary | ICD-10-CM | POA: Diagnosis not present

## 2018-08-27 DIAGNOSIS — F3181 Bipolar II disorder: Secondary | ICD-10-CM | POA: Diagnosis not present

## 2018-10-13 DIAGNOSIS — L821 Other seborrheic keratosis: Secondary | ICD-10-CM | POA: Diagnosis not present

## 2018-10-13 DIAGNOSIS — D229 Melanocytic nevi, unspecified: Secondary | ICD-10-CM | POA: Diagnosis not present

## 2018-10-13 DIAGNOSIS — L570 Actinic keratosis: Secondary | ICD-10-CM | POA: Diagnosis not present

## 2018-10-13 DIAGNOSIS — L819 Disorder of pigmentation, unspecified: Secondary | ICD-10-CM | POA: Diagnosis not present

## 2018-10-13 DIAGNOSIS — L814 Other melanin hyperpigmentation: Secondary | ICD-10-CM | POA: Diagnosis not present

## 2018-10-29 DIAGNOSIS — Z6826 Body mass index (BMI) 26.0-26.9, adult: Secondary | ICD-10-CM | POA: Diagnosis not present

## 2018-10-29 DIAGNOSIS — Z780 Asymptomatic menopausal state: Secondary | ICD-10-CM | POA: Diagnosis not present

## 2018-10-29 DIAGNOSIS — B977 Papillomavirus as the cause of diseases classified elsewhere: Secondary | ICD-10-CM | POA: Diagnosis not present

## 2018-10-29 DIAGNOSIS — Z1231 Encounter for screening mammogram for malignant neoplasm of breast: Secondary | ICD-10-CM | POA: Diagnosis not present

## 2018-10-29 DIAGNOSIS — R87611 Atypical squamous cells cannot exclude high grade squamous intraepithelial lesion on cytologic smear of cervix (ASC-H): Secondary | ICD-10-CM | POA: Diagnosis not present

## 2018-10-29 DIAGNOSIS — Z01419 Encounter for gynecological examination (general) (routine) without abnormal findings: Secondary | ICD-10-CM | POA: Diagnosis not present

## 2018-10-29 DIAGNOSIS — Z1151 Encounter for screening for human papillomavirus (HPV): Secondary | ICD-10-CM | POA: Diagnosis not present

## 2018-11-11 DIAGNOSIS — R300 Dysuria: Secondary | ICD-10-CM | POA: Diagnosis not present

## 2018-11-11 DIAGNOSIS — N3010 Interstitial cystitis (chronic) without hematuria: Secondary | ICD-10-CM | POA: Diagnosis not present

## 2018-11-12 DIAGNOSIS — N870 Mild cervical dysplasia: Secondary | ICD-10-CM | POA: Diagnosis not present

## 2018-11-12 DIAGNOSIS — R300 Dysuria: Secondary | ICD-10-CM | POA: Diagnosis not present

## 2018-11-12 DIAGNOSIS — Z3200 Encounter for pregnancy test, result unknown: Secondary | ICD-10-CM | POA: Diagnosis not present

## 2018-11-12 DIAGNOSIS — B977 Papillomavirus as the cause of diseases classified elsewhere: Secondary | ICD-10-CM | POA: Diagnosis not present

## 2018-11-25 DIAGNOSIS — N3010 Interstitial cystitis (chronic) without hematuria: Secondary | ICD-10-CM | POA: Diagnosis not present

## 2018-11-25 DIAGNOSIS — R102 Pelvic and perineal pain: Secondary | ICD-10-CM | POA: Diagnosis not present

## 2018-12-22 DIAGNOSIS — F3181 Bipolar II disorder: Secondary | ICD-10-CM | POA: Diagnosis not present

## 2018-12-23 DIAGNOSIS — N390 Urinary tract infection, site not specified: Secondary | ICD-10-CM | POA: Diagnosis not present

## 2019-01-07 DIAGNOSIS — B9620 Unspecified Escherichia coli [E. coli] as the cause of diseases classified elsewhere: Secondary | ICD-10-CM | POA: Diagnosis not present

## 2019-01-07 DIAGNOSIS — N201 Calculus of ureter: Secondary | ICD-10-CM | POA: Diagnosis not present

## 2019-01-07 DIAGNOSIS — R350 Frequency of micturition: Secondary | ICD-10-CM | POA: Diagnosis not present

## 2019-01-07 DIAGNOSIS — N3010 Interstitial cystitis (chronic) without hematuria: Secondary | ICD-10-CM | POA: Diagnosis not present

## 2019-01-07 DIAGNOSIS — R300 Dysuria: Secondary | ICD-10-CM | POA: Diagnosis not present

## 2019-01-07 DIAGNOSIS — K5730 Diverticulosis of large intestine without perforation or abscess without bleeding: Secondary | ICD-10-CM | POA: Diagnosis not present

## 2019-01-07 DIAGNOSIS — R109 Unspecified abdominal pain: Secondary | ICD-10-CM | POA: Diagnosis not present

## 2019-01-07 DIAGNOSIS — R102 Pelvic and perineal pain: Secondary | ICD-10-CM | POA: Diagnosis not present

## 2019-01-07 DIAGNOSIS — N132 Hydronephrosis with renal and ureteral calculous obstruction: Secondary | ICD-10-CM | POA: Diagnosis not present

## 2019-01-07 DIAGNOSIS — N390 Urinary tract infection, site not specified: Secondary | ICD-10-CM | POA: Diagnosis not present

## 2019-01-08 ENCOUNTER — Ambulatory Visit (HOSPITAL_BASED_OUTPATIENT_CLINIC_OR_DEPARTMENT_OTHER)
Admission: RE | Admit: 2019-01-08 | Discharge: 2019-01-08 | Disposition: A | Discharge status: Home / Self Care | Payer: BLUE CROSS/BLUE SHIELD | Source: Ambulatory Visit | Attending: Urology | Admitting: Urology

## 2019-01-08 ENCOUNTER — Other Ambulatory Visit: Payer: Self-pay

## 2019-01-08 ENCOUNTER — Encounter (HOSPITAL_BASED_OUTPATIENT_CLINIC_OR_DEPARTMENT_OTHER): Payer: Self-pay | Admitting: Anesthesiology

## 2019-01-08 ENCOUNTER — Other Ambulatory Visit: Payer: Self-pay | Admitting: Urology

## 2019-01-08 ENCOUNTER — Ambulatory Visit (HOSPITAL_BASED_OUTPATIENT_CLINIC_OR_DEPARTMENT_OTHER): Payer: BLUE CROSS/BLUE SHIELD | Admitting: Anesthesiology

## 2019-01-08 ENCOUNTER — Encounter (HOSPITAL_BASED_OUTPATIENT_CLINIC_OR_DEPARTMENT_OTHER)
Admission: RE | Disposition: A | Discharge status: Home / Self Care | Payer: Self-pay | Source: Ambulatory Visit | Attending: Urology

## 2019-01-08 ENCOUNTER — Ambulatory Visit (HOSPITAL_COMMUNITY): Payer: BLUE CROSS/BLUE SHIELD

## 2019-01-08 ENCOUNTER — Other Ambulatory Visit (HOSPITAL_COMMUNITY): Payer: Self-pay

## 2019-01-08 ENCOUNTER — Other Ambulatory Visit (HOSPITAL_COMMUNITY)
Admission: RE | Admit: 2019-01-08 | Discharge: 2019-01-08 | Disposition: A | Discharge status: Home / Self Care | Payer: BLUE CROSS/BLUE SHIELD | Source: Ambulatory Visit | Attending: Urology | Admitting: Urology

## 2019-01-08 DIAGNOSIS — N200 Calculus of kidney: Secondary | ICD-10-CM

## 2019-01-08 DIAGNOSIS — Z800 Family history of malignant neoplasm of digestive organs: Secondary | ICD-10-CM | POA: Insufficient documentation

## 2019-01-08 DIAGNOSIS — Z87891 Personal history of nicotine dependence: Secondary | ICD-10-CM | POA: Insufficient documentation

## 2019-01-08 DIAGNOSIS — Z87440 Personal history of urinary (tract) infections: Secondary | ICD-10-CM | POA: Insufficient documentation

## 2019-01-08 DIAGNOSIS — Z1159 Encounter for screening for other viral diseases: Secondary | ICD-10-CM | POA: Insufficient documentation

## 2019-01-08 DIAGNOSIS — Z808 Family history of malignant neoplasm of other organs or systems: Secondary | ICD-10-CM | POA: Diagnosis not present

## 2019-01-08 DIAGNOSIS — Z79899 Other long term (current) drug therapy: Secondary | ICD-10-CM | POA: Diagnosis not present

## 2019-01-08 DIAGNOSIS — E785 Hyperlipidemia, unspecified: Secondary | ICD-10-CM | POA: Diagnosis not present

## 2019-01-08 DIAGNOSIS — F418 Other specified anxiety disorders: Secondary | ICD-10-CM | POA: Diagnosis not present

## 2019-01-08 DIAGNOSIS — N201 Calculus of ureter: Secondary | ICD-10-CM | POA: Insufficient documentation

## 2019-01-08 DIAGNOSIS — D649 Anemia, unspecified: Secondary | ICD-10-CM | POA: Diagnosis not present

## 2019-01-08 HISTORY — PX: CYSTOSCOPY WITH URETEROSCOPY AND STENT PLACEMENT: SHX6377

## 2019-01-08 LAB — SARS CORONAVIRUS 2 BY RT PCR (HOSPITAL ORDER, PERFORMED IN ~~LOC~~ HOSPITAL LAB): SARS Coronavirus 2: NEGATIVE

## 2019-01-08 SURGERY — CYSTOURETEROSCOPY, WITH STENT INSERTION
Anesthesia: General | Site: Ureter | Laterality: Right

## 2019-01-08 MED ORDER — GENTAMICIN SULFATE 40 MG/ML IJ SOLN
5.0000 mg/kg | INTRAVENOUS | Status: DC
  Filled 2019-01-08: qty 9

## 2019-01-08 MED ORDER — SODIUM CHLORIDE 0.9 % IR SOLN
Status: DC | PRN
  Administered 2019-01-08: 3000 mL

## 2019-01-08 MED ORDER — FENTANYL CITRATE (PF) 100 MCG/2ML IJ SOLN
INTRAMUSCULAR | Status: DC | PRN
  Administered 2019-01-08: 50 ug via INTRAVENOUS

## 2019-01-08 MED ORDER — LACTATED RINGERS IV SOLN
INTRAVENOUS | Status: DC
  Administered 2019-01-08: 13:00:00 via INTRAVENOUS
  Filled 2019-01-08: qty 1000

## 2019-01-08 MED ORDER — KETOROLAC TROMETHAMINE 30 MG/ML IJ SOLN
INTRAMUSCULAR | Status: AC
  Filled 2019-01-08: qty 1

## 2019-01-08 MED ORDER — IOHEXOL 300 MG/ML  SOLN
INTRAMUSCULAR | Status: DC | PRN
  Administered 2019-01-08: 13:00:00 20 mL via URETHRAL

## 2019-01-08 MED ORDER — PROMETHAZINE HCL 25 MG/ML IJ SOLN
6.2500 mg | INTRAMUSCULAR | Status: DC | PRN
  Filled 2019-01-08: qty 1

## 2019-01-08 MED ORDER — MIDAZOLAM HCL 2 MG/2ML IJ SOLN
INTRAMUSCULAR | Status: AC
  Filled 2019-01-08: qty 2

## 2019-01-08 MED ORDER — BELLADONNA ALKALOIDS-OPIUM 16.2-60 MG RE SUPP
RECTAL | Status: DC | PRN
  Administered 2019-01-08: 1 via RECTAL

## 2019-01-08 MED ORDER — OXYCODONE HCL 5 MG/5ML PO SOLN
5.0000 mg | Freq: Once | ORAL | Status: DC | PRN
  Filled 2019-01-08: qty 5

## 2019-01-08 MED ORDER — MIDAZOLAM HCL 2 MG/2ML IJ SOLN
INTRAMUSCULAR | Status: DC | PRN
  Administered 2019-01-08: 2 mg via INTRAVENOUS

## 2019-01-08 MED ORDER — ONDANSETRON HCL 4 MG/2ML IJ SOLN
INTRAMUSCULAR | Status: DC | PRN
  Administered 2019-01-08: 4 mg via INTRAVENOUS

## 2019-01-08 MED ORDER — DEXAMETHASONE SODIUM PHOSPHATE 10 MG/ML IJ SOLN
INTRAMUSCULAR | Status: DC | PRN
  Administered 2019-01-08: 5 mg via INTRAVENOUS

## 2019-01-08 MED ORDER — KETOROLAC TROMETHAMINE 30 MG/ML IJ SOLN
INTRAMUSCULAR | Status: DC | PRN
  Administered 2019-01-08: 30 mg via INTRAVENOUS

## 2019-01-08 MED ORDER — PROPOFOL 10 MG/ML IV BOLUS
INTRAVENOUS | Status: DC | PRN
  Administered 2019-01-08: 160 mg via INTRAVENOUS

## 2019-01-08 MED ORDER — GENTAMICIN SULFATE 40 MG/ML IJ SOLN
5.0000 mg/kg | INTRAVENOUS | Status: AC
  Administered 2019-01-08: 13:00:00 360 mg via INTRAVENOUS
  Filled 2019-01-08 (×2): qty 9

## 2019-01-08 MED ORDER — FENTANYL CITRATE (PF) 100 MCG/2ML IJ SOLN
25.0000 ug | INTRAMUSCULAR | Status: DC | PRN
  Filled 2019-01-08: qty 1

## 2019-01-08 MED ORDER — LIDOCAINE 2% (20 MG/ML) 5 ML SYRINGE
INTRAMUSCULAR | Status: DC | PRN
  Administered 2019-01-08: 80 mg via INTRAVENOUS

## 2019-01-08 MED ORDER — OXYCODONE HCL 5 MG PO TABS
5.0000 mg | ORAL_TABLET | Freq: Once | ORAL | Status: DC | PRN
  Filled 2019-01-08: qty 1

## 2019-01-08 MED ORDER — BELLADONNA ALKALOIDS-OPIUM 16.2-60 MG RE SUPP
RECTAL | Status: AC
  Filled 2019-01-08: qty 1

## 2019-01-08 MED ORDER — EPHEDRINE SULFATE 50 MG/ML IJ SOLN
INTRAMUSCULAR | Status: DC | PRN
  Administered 2019-01-08 (×2): 10 mg via INTRAVENOUS

## 2019-01-08 MED ORDER — FENTANYL CITRATE (PF) 100 MCG/2ML IJ SOLN
INTRAMUSCULAR | Status: AC
  Filled 2019-01-08: qty 2

## 2019-01-08 MED ORDER — ACETAMINOPHEN 10 MG/ML IV SOLN
1000.0000 mg | Freq: Once | INTRAVENOUS | Status: DC | PRN
  Filled 2019-01-08: qty 100

## 2019-01-08 SURGICAL SUPPLY — 24 items
BAG DRAIN URO-CYSTO SKYTR STRL (DRAIN) ×3 IMPLANT
BAG DRN UROCATH (DRAIN) ×1
BASKET LASER NITINOL 1.9FR (BASKET) IMPLANT
BSKT STON RTRVL 120 1.9FR (BASKET)
CATH URET 5FR 28IN OPEN ENDED (CATHETERS) ×3 IMPLANT
CATH URET DUAL LUMEN 6-10FR 50 (CATHETERS) IMPLANT
CLOTH BEACON ORANGE TIMEOUT ST (SAFETY) ×3 IMPLANT
EXTRACTOR STONE 1.7FRX115CM (UROLOGICAL SUPPLIES) IMPLANT
EXTRACTOR STONE NITINOL NGAGE (UROLOGICAL SUPPLIES) ×2 IMPLANT
FIBER LASER FLEXIVA 365 (UROLOGICAL SUPPLIES) ×2 IMPLANT
FIBER LASER TRAC TIP (UROLOGICAL SUPPLIES) IMPLANT
GLOVE BIO SURGEON STRL SZ7.5 (GLOVE) ×3 IMPLANT
GOWN STRL REUS W/TWL XL LVL3 (GOWN DISPOSABLE) ×3 IMPLANT
GUIDEWIRE ANG ZIPWIRE 038X150 (WIRE) IMPLANT
GUIDEWIRE STR DUAL SENSOR (WIRE) ×3 IMPLANT
IV NS IRRIG 3000ML ARTHROMATIC (IV SOLUTION) ×6 IMPLANT
KIT TURNOVER CYSTO (KITS) ×3 IMPLANT
MANIFOLD NEPTUNE II (INSTRUMENTS) ×2 IMPLANT
NS IRRIG 500ML POUR BTL (IV SOLUTION) ×3 IMPLANT
PACK CYSTO (CUSTOM PROCEDURE TRAY) ×3 IMPLANT
STENT POLARIS LOOP 6FR X 24 CM (STENTS) ×2 IMPLANT
TUBE CONNECTING 12'X1/4 (SUCTIONS)
TUBE CONNECTING 12X1/4 (SUCTIONS) IMPLANT
TUBING UROLOGY SET (TUBING) ×3 IMPLANT

## 2019-01-08 NOTE — Anesthesia Procedure Notes (Signed)
Procedure Name: LMA Insertion Date/Time: 01/08/2019 12:53 PM Performed by: Tyrone Nine, CRNA Pre-anesthesia Checklist: Patient identified, Emergency Drugs available, Suction available and Patient being monitored Patient Re-evaluated:Patient Re-evaluated prior to induction Oxygen Delivery Method: Circle system utilized Preoxygenation: Pre-oxygenation with 100% oxygen Induction Type: IV induction Ventilation: Mask ventilation without difficulty LMA: LMA inserted LMA Size: 4.0 Number of attempts: 1 Airway Equipment and Method: Bite block

## 2019-01-08 NOTE — Anesthesia Preprocedure Evaluation (Addendum)
Anesthesia Evaluation  Patient identified by MRN, date of birth, ID band Patient awake    Reviewed: Allergy & Precautions, NPO status , Patient's Chart, lab work & pertinent test results  Airway Mallampati: II  TM Distance: >3 FB Neck ROM: Full    Dental no notable dental hx. (+) Teeth Intact, Dental Advisory Given   Pulmonary former smoker,    Pulmonary exam normal breath sounds clear to auscultation       Cardiovascular Exercise Tolerance: Good negative cardio ROS Normal cardiovascular exam Rhythm:Regular Rate:Normal     Neuro/Psych Anxiety Depression negative neurological ROS     GI/Hepatic Neg liver ROS, PUD,   Endo/Other  negative endocrine ROS  Renal/GU Renal stone     Musculoskeletal negative musculoskeletal ROS (+)   Abdominal   Peds  Hematology negative hematology ROS (+)   Anesthesia Other Findings Day of surgery medications reviewed with the patient.  Reproductive/Obstetrics                           Anesthesia Physical Anesthesia Plan  ASA: II  Anesthesia Plan: General   Post-op Pain Management:    Induction: Intravenous  PONV Risk Score and Plan: 3 and Treatment may vary due to age or medical condition, Ondansetron, Dexamethasone and Midazolam  Airway Management Planned: LMA  Additional Equipment:   Intra-op Plan:   Post-operative Plan: Extubation in OR  Informed Consent: I have reviewed the patients History and Physical, chart, labs and discussed the procedure including the risks, benefits and alternatives for the proposed anesthesia with the patient or authorized representative who has indicated his/her understanding and acceptance.     Dental advisory given  Plan Discussed with:   Anesthesia Plan Comments:        Anesthesia Quick Evaluation

## 2019-01-08 NOTE — Discharge Instructions (Signed)
DISCHARGE INSTRUCTIONS FOR KIDNEY STONE/URETERAL STENT   MEDICATIONS:  1.  Resume all your other meds from home - except do not take any extra narcotic pain meds that you may have at home.  2. You may use the valium prescribed in the office for bladder discomfort. 3.  You can take the uribel for burning with urination. 4.  Continue to take the Bactrim as prescribed.  ACTIVITY:  1. No strenuous activity x 1week  2. No driving while on narcotic pain medications  3. Drink plenty of water  4. Continue to walk at home - you can still get blood clots when you are at home, so keep active, but don't over do it.  5. May return to work/school tomorrow or when you feel ready   BATHING:  1. You can shower and we recommend daily showers  2. You have a string coming from your urethra: The stent string is attached to your ureteral stent. Do not pull on this.   SIGNS/SYMPTOMS TO CALL:  Please call us if you have a fever greater than 101.5, uncontrolled nausea/vomiting, uncontrolled pain, dizziness, unable to urinate, bloody urine, chest pain, shortness of breath, leg swelling, leg pain, redness around wound, drainage from wound, or any other concerns or questions.   You can reach [place] at [phone].   FOLLOW-UP:  1. You have an appointment in 6 weeks with a ultrasound of your kidneys prior.  2. You have a string attached to your stent, you may remove it on Tuesday May 26th . To do this, pull the strings until the stents are completely removed. You may feel an odd sensation in your back.  [facility] [phone] Post Ureteroscopy With or Without Stent Instructions  Definitions:  Ureter: The duct that transports urine from the kidney to the bladder. Stent:   A plastic hollow tube that is placed into the ureter, from the kidney to the   bladder to prevent the ureter from swelling shut.  GENERAL INSTRUCTIONS:  Despite the fact that no skin incisions were used, the area around the  ureter and bladder is raw and irritated. The stent is a foreign body which will further irritate the bladder wall. This irritation is manifested by increased frequency of urination, both day and night, and by an increase in the urge to urinate. In some, the urge to urinate is present almost always. Sometimes the urge is strong enough that you may not be able to stop yourself from urinating. The only real cure is to remove the stent and then give time for the bladder wall to heal which can't be done until the danger of the ureter swelling shut has passed, which varies.  You may see some blood in your urine while the stent is in place and a few days afterwards. Do not be alarmed, even if the urine was clear for a while. Get off your feet and drink lots of fluids until clearing occurs. If you start to pass clots or don't improve, call us.  DIET: You may return to your normal diet immediately. Because of the raw surface of your bladder, alcohol, spicy foods, acid type foods and drinks with caffeine may cause irritation or frequency and should be used in moderation. To keep your urine flowing freely and to avoid constipation, drink plenty of fluids during the day ( 8-10 glasses ). Tip: Avoid cranberry juice because it is very acidic.  ACTIVITY: Your physical activity doesn't need to be restricted. However, if you are  very active, you may see some blood in your urine. We suggest that you reduce your activity under these circumstances until the bleeding has stopped.  BOWELS: It is important to keep your bowels regular during the postoperative period. Straining with bowel movements can cause bleeding. A bowel movement every other day is reasonable. Use a mild laxative if needed, such as Milk of Magnesia 2-3 tablespoons, or 2 Dulcolax tablets. Call if you continue to have problems. If you have been taking narcotics for pain, before, during or after your surgery, you may be constipated. Take a laxative if  necessary.   MEDICATION: You should resume your pre-surgery medications unless told not to. In addition you will often be given an antibiotic to prevent infection. These should be taken as prescribed until the bottles are finished unless you are having an unusual reaction to one of the drugs.  PROBLEMS YOU SHOULD REPORT TO US:  Fevers over 100.5 Fahrenheit.  Heavy bleeding, or clots ( See above notes about blood in urine ).  Inability to urinate.  Drug reactions ( hives, rash, nausea, vomiting, diarrhea ).  Severe burning or pain with urination that is not improving.  FOLLOW-UP: You will need a follow-up appointment to monitor your progress. Call for this appointment at the number listed above. Usually the first appointment will be about three to fourteen days after your surgery.  Post Anesthesia Home Care Instructions  Activity: Get plenty of rest for the remainder of the day. A responsible individual must stay with you for 24 hours following the procedure.  For the next 24 hours, DO NOT: -Drive a car -Advertising copywriterperate machinery -Drink alcoholic beverages -Take any medication unless instructed by your physician -Make any legal decisions or sign important papers.  Meals: Start with liquid foods such as gelatin or soup. Progress to regular foods as tolerated. Avoid greasy, spicy, heavy foods. If nausea and/or vomiting occur, drink only clear liquids until the nausea and/or vomiting subsides. Call your physician if vomiting continues.  Special Instructions/Symptoms: Your throat may feel dry or sore from the anesthesia or the breathing tube placed in your throat during surgery. If this causes discomfort, gargle with warm salt water. The discomfort should disappear within 24 hours.  If you had a scopolamine patch placed behind your ear for the management of post- operative nausea and/or vomiting:  1. The medication in the patch is effective for 72 hours, after which it should be removed.   Wrap patch in a tissue and discard in the trash. Wash hands thoroughly with soap and water. 2. You may remove the patch earlier than 72 hours if you experience unpleasant side effects which may include dry mouth, dizziness or visual disturbances. 3. Avoid touching the patch. Wash your hands with soap and water after contact with the patch.

## 2019-01-08 NOTE — Op Note (Signed)
Preoperative diagnosis:  1. Right distal ureteral stones  Postoperative diagnosis:  1. Same  Procedure: 1. Cystoscopy, right retrograde pyelogram with interpretation 2. Right ureteroscopy, laser lithotripsy stone extraction 3. Right ureteral stent placement  Surgeon: Crist Fat, MD  Anesthesia: General  Complications: None  Intraoperative findings:  #1: The patient's bladder was normal-appearing.  There were no significant bladder mucosal abnormalities.  The ureter orifice ease were orthotopic. #2: The patient's right retrograde pyelogram demonstrated a large filling defect in the distal ureteral ureter as well as the UVJ.  There was proximal hydroureteronephrosis. #3: The obstructing stones were removed and a 6 x 24 cm ureteral stent was placed.  The stent tether was left on the patient's stent and brought through the urethra.  She will take this out on Tuesday, May 26  EBL: Minimal  Specimens: None  Indication: Brooke Phillips is a 58 y.o. patient with severe pelvic pain and voiding symptoms.  She was having abnormal urine analyses as well.  A CT scan was performed and demonstrated distal ureteral stones on the right side.  She also has bilateral nonobstructing stones.  We discussed treatment options and we opted to proceed urgently with stone removal.  After reviewing the management options for treatment, he elected to proceed with the above surgical procedure(s). We have discussed the potential benefits and risks of the procedure, side effects of the proposed treatment, the likelihood of the patient achieving the goals of the procedure, and any potential problems that might occur during the procedure or recuperation. Informed consent has been obtained.  Description of procedure:  The patient was taken to the operating room and general anesthesia was induced.  The patient was placed in the dorsal lithotomy position, prepped and draped in the usual sterile fashion, and  preoperative antibiotics were administered. A preoperative time-out was performed.   21 French 30 degrees cystoscope was gently passed through the patient's urethra and the bladder under visual guidance.  Cystoscopy was performed with the above findings.  I advanced a 5 [place] open-ended ureteral catheter through the scope and into the right ureteral orifice and performed retrograde pyelogram with the above findings.  I then advanced a 0.038 sensor wire through the open-ended catheter and up into the right renal pelvis removing the catheter over the wire.  I then exchanged the cystoscope for a semirigid ureteroscope and cannulated the patient's right ureteral orifice.  I encountered the stone in the UVJ and using a 365 m laser fiber fragmented the stone into several smaller pieces.  I then advanced the scope up to the distal ureteral stone and fragmented that stone as well.  I used an engage basket to remove the stone fragments.  I then reexamined the patient's right ureter up to the UPJ noting no significant stone fragments remaining.  I then removed the ureteroscope and passed a 6 [place] x24 cm double-J ureteral stent over the wire and into the patient's right renal pelvis under fluoroscopic guidance.  Once the stent was noted to be well positioned within the renal pelvis I advanced the stent to the patient's urethral meatus and remove the wire entirely.  The stent curl was noted to curl into the patient's bladder.  The stent tether was left on and tucked into the patient's vagina.  A B&O suppository was placed in the patient's rectum and she was subsequently extubated return the PACU stable condition.  Disposition: The patient stent will be removed at home on Tuesday, May 26.  Earle Gell.  Louis Meckel, M.D.

## 2019-01-08 NOTE — H&P (Signed)
NP eval for dysuria  HPI: Brooke Phillips is a 58 year-old female patient who was referred by Dr. Tresa Endo A. Ernestina Penna, MD who is here evaluation of voiding dysfunction/pelvic pain.  The patient has a history of recurrent urinary tract infections. The patient has been treated for a recent UTI. The patient's symptoms did temporarily improve following treatment.   She does have dysuria. The patient's pain is constant, no associated with urination or a full bladder.   She does have frequency and urgency. She does not have hematuria. She is having problems getting her urine stream started. She does not have to strain or bear down to start her urinary stream. Her urinary stream does start and stop during voiding. She does not have a split stream when she urinates. She often has a feeling of incomplete bladder emptying. She has nocturia 5+ times per night.   She does not have trouble with constipation. The patient states that recently they have had more stress in life.   She has not had a kidney stone. She has not had pelvic surgery within the last 12 months.   History of recurrent UTI. She is currently being treated with antibiotics as her UA was suspicious for another UTI. She completed keflex 4/20 and Bactrim 12/23/18. History of interstitial cystitis.   Referred by [facility] by Dr. Ernestina Penna for dysuria, frequency, and urgency. Last night she reported getting up to void 20 times with at times dribbling of urine off and on but not a consistent stream.     ALLERGIES: None   MEDICATIONS: Bactrim Ds 800 mg-160 mg tablet  Escitalopram Oxalate  Estradiol 0.1 mg/24 hour patch, transdermal weekly  Folic Acid 1 mg tablet  Temazepam  Uribel 118 mg-10 mg-40.8 mg-36 mg-0.12 mg capsule     GU PSH: None     PSH Notes: LEEP procedure (03/10/2014), Tummy tuck (01/2013)   NON-GU PSH: Bilateral Tubal Ligation, 05/01/1990 Hernia Repair, 1967 Neck Surgery, neck disc surgery (07/01/2017)    GU PMH: Mild  cervical dysplasia    NON-GU PMH: Abnormal findings on diagnostic imaging of other specified body structures Anxiety Depression High grade squamous intraepithelial lesion on cytologic smear of cervix (HGSIL) Papillomavirus as the cause of diseases classified elsewhere Unspecified abnormal cytological findings in specimens from cervix uteri    FAMILY HISTORY: 2 sons - Son Brain Cancer - Father Diverticulitis - Mother Esophageal Cancer - Father   SOCIAL HISTORY: Marital Status: Widowed Preferred Language: English; Ethnicity: Not Hispanic Or Latino; Race: White Current Smoking Status: Patient does not smoke anymore. Has not smoked since 12/17/1988. Smoked for 5 years.   Tobacco Use Assessment Completed: Used Tobacco in last 30 days? Has never drank.  Drinks 1 caffeinated drink per day. Patient's occupation Actuary.    REVIEW OF SYSTEMS:    GU Review Female:   Patient reports frequent urination, hard to postpone urination, burning /pain with urination, get up at night to urinate, and trouble starting your stream. Patient denies leakage of urine, stream starts and stops, have to strain to urinate, and being pregnant.  Gastrointestinal (Upper):   Patient denies nausea, vomiting, and indigestion/ heartburn.  Gastrointestinal (Lower):   Patient denies diarrhea and constipation.  Constitutional:   Patient denies fever, night sweats, weight loss, and fatigue.  Skin:   Patient denies skin rash/ lesion and itching.  Eyes:   Patient denies blurred vision and double vision.  Ears/ Nose/ Throat:   Patient denies sore throat and sinus problems.  Hematologic/Lymphatic:  Patient denies swollen glands and easy bruising.  Cardiovascular:   Patient denies leg swelling and chest pains.  Respiratory:   Patient denies cough and shortness of breath.  Endocrine:   Patient denies excessive thirst.  Musculoskeletal:   Patient reports back pain. Patient denies joint pain.  Neurological:    Patient denies headaches and dizziness.  Psychologic:   Patient reports depression. Patient denies anxiety.   VITAL SIGNS:      01/07/2019 03:33 PM  Weight 160 lb / 72.57 kg  BP 122/71 mmHg  Pulse 66 /min  Temperature 98.1 F / 36.7 C   MULTI-SYSTEM PHYSICAL EXAMINATION:    Constitutional: Well-nourished. No physical deformities. Normally developed. Good grooming.  Neck: Neck symmetrical, not swollen. Normal tracheal position.  Respiratory: No labored breathing, no use of accessory muscles.   Cardiovascular: Normal temperature, normal extremity pulses, no swelling, no varicosities.  Lymphatic: No enlargement of neck, axillae, groin.  Skin: No paleness, no jaundice, no cyanosis. No lesion, no ulcer, no rash.  Neurologic / Psychiatric: Oriented to time, oriented to place, oriented to person. No depression, no anxiety, no agitation.  Gastrointestinal: No mass, no tenderness, no rigidity, non obese abdomen.  Eyes: Normal conjunctivae. Normal eyelids.  Ears, Nose, Mouth, and Throat: Left ear no scars, no lesions, no masses. Right ear no scars, no lesions, no masses. Nose no scars, no lesions, no masses. Normal hearing. Normal lips.  Musculoskeletal: Normal gait and station of head and neck.     PAST DATA REVIEWED:  Source Of History:  Patient  Records Review:   Previous Doctor Records, Previous Patient Records, POC Tool  X-Ray Review: C.T. Abdomen/Pelvis: Reviewed Films. Discussed With Patient.     PROCEDURES:         C.T. Urogram - [id number]  The patient has a 6 x 4 mm right distal ureteral stone with proximal hydroureteronephrosis      Patient confirmed No Neulasta OnPro Device.          PVR Ultrasound - [id number]  Scanned Volume: 51 cc         Urinalysis w/Scope - 81001 Dipstick Dipstick Cont'd Micro  Specimen: Voided Bilirubin: Neg mg/dL WBC/hpf: Packed/hpf  Color: Green Ketones: Neg mg/dL RBC/hpf: 10 - 21/HYQ  Appearance: Cloudy Blood: 2+ ery/uL Bacteria: Many (>50/hpf)   Specific Gravity: 1.020 Protein: 2+ mg/dL Cystals: NS (Not Seen)  pH: 6.0 Urobilinogen: 0.2 mg/dL Casts: NS (Not Seen)  Glucose: Neg mg/dL Nitrites: Positive Trichomonas: Not Present    Leukocyte Esterase: 3+ leu/uL Mucous: Not Present      Epithelial Cells: NS (Not Seen)      Yeast: NS (Not Seen)      Sperm: Not Present         Ketoralac 60mg  - [id number], [id number] Qty: 60 Adm. By: Lyndal Rainbow  Unit: mg Lot No [id number]  Route: IM Exp. Date 09/20/2019  Freq: None Mfgr.:   Site: Right Buttock   ASSESSMENT:      ICD-10 Details  1 GU:   Dysuria - R30.0   2   Pelvic/perineal pain - R10.2   3   Ureteral calculus - N20.1    PLAN:            Medications New Meds: Mobic 15 mg tablet 1 tablet PO Daily   #30  1 Refill(s)  Myrbetriq 50 mg tablet, extended release 24 hr 1 tablet PO Daily   #30  1 Refill(s)  Valium 10 mg tablet 1 tablet Per Vagina Q HS   #  30  2 Refill(s)            Orders Labs Urine Culture  X-Rays: C.T. Stone Protocol Without Contrast  X-Ray Notes: History:  Hematuria: Yes/No  Patient to see MD after exam: Yes/No  Previous exam: CT / IVP/ US/ KUB/ None  When:  Where:  Diabetic: Yes/ No  BUN/ Creatinine:  Date of last BUN Creatinine:  Weight in pounds:  Allergy- IV Contrast: Yes/ No  Conflicting diabetic meds: Yes/ No  Diabetic Meds:  Prior Authorization #: [id number]           Schedule Return Visit/Planned Activity: 3 Months  Return Visit/Planned Activity: ASAP - PT/OT Referral          Document Letter(s):  Created for Patient: Clinical Summary         Notes:   The patient I went over her symptoms and initially I thought that this was all pelvic floor dysfunction and chronic pelvic pain. I had her set up for physical therapy as well as prescribed her intravaginal Valium, Myrbetriq, and meloxicam for them muscle inflammation. We did however order a CT scan which she obtained before leaving the office today which did in fact demonstrate a 6 mm x 4  mm right distal ureteral stone. I called the patient to discuss these findings with her and let her know that she actually did have a kidney stone and this may in fact be the etiology of all of her symptoms over the last 3 months. Our plan after going over the treatment options is to proceed with ureteroscopy on an urgent basis to try to remove the kidney stone in alleviate some of her symptoms. I am hopeful that by treating the stone all the other symptoms that she has been experiencing will resolve. We will try to get this scheduled tomorrow or over the next several days.

## 2019-01-08 NOTE — Interval H&P Note (Signed)
History and Physical Interval Note:  01/08/2019 12:37 PM  Brooke Phillips  has presented today for surgery, with the diagnosis of RIGHT DISTAL URETERAL STONE.  The various methods of treatment have been discussed with the patient and family. After consideration of risks, benefits and other options for treatment, the patient has consented to  Procedure(s): CYSTOSCOPY WITH URETEROSCOPY LASER LITHO AND STENT PLACEMENT (Right) as a surgical intervention.  The patient's history has been reviewed, patient examined, no change in status, stable for surgery.  I have reviewed the patient's chart and labs.  Questions were answered to the patient's satisfaction.     Crist Fat

## 2019-01-08 NOTE — Transfer of Care (Signed)
Immediate Anesthesia Transfer of Care Note  Patient: Brooke Phillips  Procedure(s) Performed: CYSTOSCOPY WITH URETEROSCOPY LASER LITHO AND STENT PLACEMENT (Right Ureter)  Patient Location: PACU  Anesthesia Type:General  Level of Consciousness: awake, alert , oriented and patient cooperative  Airway & Oxygen Therapy: Patient Spontanous Breathing and Patient connected to nasal cannula oxygen  Post-op Assessment: Report given to RN and Post -op Vital signs reviewed and stable  Post vital signs: Reviewed and stable  Last Vitals:  Vitals Value Taken Time  BP 108/62 01/08/2019  1:39 PM  Temp    Pulse 73 01/08/2019  1:40 PM  Resp    SpO2 96 % 01/08/2019  1:40 PM  Vitals shown include unvalidated device data.  Last Pain:  Vitals:   01/08/19 1229  TempSrc:   PainSc: 5       Patients Stated Pain Goal: 6 (01/08/19 1229)  Complications: No apparent anesthesia complications

## 2019-01-09 NOTE — Anesthesia Postprocedure Evaluation (Signed)
Anesthesia Post Note  Patient: Brooke Phillips  Procedure(s) Performed: CYSTOSCOPY WITH URETEROSCOPY LASER LITHO AND STENT PLACEMENT (Right Ureter)     Patient location during evaluation: PACU Anesthesia Type: General Level of consciousness: awake and alert Pain management: pain level controlled Vital Signs Assessment: post-procedure vital signs reviewed and stable Respiratory status: spontaneous breathing, nonlabored ventilation, respiratory function stable and patient connected to nasal cannula oxygen Cardiovascular status: blood pressure returned to baseline and stable Postop Assessment: no apparent nausea or vomiting Anesthetic complications: no    Last Vitals:  Vitals:   01/08/19 1415 01/08/19 1453  BP: 119/64 117/74  Pulse: 72 70  Resp: 12 13  Temp:  36.6 C  SpO2: 93% 100%    Last Pain:  Vitals:   01/08/19 1500  TempSrc:   PainSc: 0-No pain   Pain Goal: Patients Stated Pain Goal: 6 (01/08/19 1229)                 Trevor Iha

## 2019-01-12 ENCOUNTER — Encounter (HOSPITAL_BASED_OUTPATIENT_CLINIC_OR_DEPARTMENT_OTHER): Payer: Self-pay | Admitting: Urology

## 2019-01-12 DIAGNOSIS — N201 Calculus of ureter: Secondary | ICD-10-CM | POA: Diagnosis not present

## 2019-01-12 LAB — POCT I-STAT, CHEM 8
BUN: 20 mg/dL (ref 6–20)
Calcium, Ion: 1.29 mmol/L (ref 1.15–1.40)
Chloride: 109 mmol/L (ref 98–111)
Creatinine, Ser: 0.9 mg/dL (ref 0.44–1.00)
Glucose, Bld: 74 mg/dL (ref 70–99)
HCT: 36 % (ref 36.0–46.0)
Hemoglobin: 12.2 g/dL (ref 12.0–15.0)
Potassium: 3.4 mmol/L — ABNORMAL LOW (ref 3.5–5.1)
Sodium: 141 mmol/L (ref 135–145)
TCO2: 26 mmol/L (ref 22–32)

## 2019-01-22 DIAGNOSIS — R300 Dysuria: Secondary | ICD-10-CM | POA: Diagnosis not present

## 2019-02-16 DIAGNOSIS — N201 Calculus of ureter: Secondary | ICD-10-CM | POA: Diagnosis not present

## 2019-02-25 DIAGNOSIS — F3181 Bipolar II disorder: Secondary | ICD-10-CM | POA: Diagnosis not present

## 2019-04-08 DIAGNOSIS — F411 Generalized anxiety disorder: Secondary | ICD-10-CM | POA: Diagnosis not present

## 2019-04-08 DIAGNOSIS — F330 Major depressive disorder, recurrent, mild: Secondary | ICD-10-CM | POA: Diagnosis not present

## 2019-04-10 DIAGNOSIS — Z23 Encounter for immunization: Secondary | ICD-10-CM | POA: Diagnosis not present

## 2019-06-11 DIAGNOSIS — Z23 Encounter for immunization: Secondary | ICD-10-CM | POA: Diagnosis not present

## 2019-06-22 DIAGNOSIS — F3181 Bipolar II disorder: Secondary | ICD-10-CM | POA: Diagnosis not present

## 2019-08-12 DIAGNOSIS — R0981 Nasal congestion: Secondary | ICD-10-CM | POA: Diagnosis not present

## 2019-08-12 DIAGNOSIS — M791 Myalgia, unspecified site: Secondary | ICD-10-CM | POA: Diagnosis not present

## 2019-08-12 DIAGNOSIS — R519 Headache, unspecified: Secondary | ICD-10-CM | POA: Diagnosis not present

## 2019-08-12 DIAGNOSIS — Z20828 Contact with and (suspected) exposure to other viral communicable diseases: Secondary | ICD-10-CM | POA: Diagnosis not present

## 2021-06-22 ENCOUNTER — Other Ambulatory Visit: Payer: Self-pay | Admitting: Obstetrics

## 2021-06-27 ENCOUNTER — Other Ambulatory Visit: Payer: Self-pay

## 2021-06-27 ENCOUNTER — Encounter (HOSPITAL_BASED_OUTPATIENT_CLINIC_OR_DEPARTMENT_OTHER): Payer: Self-pay | Admitting: Obstetrics

## 2021-06-27 DIAGNOSIS — Z01812 Encounter for preprocedural laboratory examination: Secondary | ICD-10-CM | POA: Diagnosis present

## 2021-06-27 DIAGNOSIS — Z20822 Contact with and (suspected) exposure to covid-19: Secondary | ICD-10-CM | POA: Diagnosis not present

## 2021-06-27 NOTE — Progress Notes (Signed)
Spoke w/ via phone for pre-op interview---pt Lab needs dos----  none             Lab results------lab appt 07-03-2021 1400 cbc bmp t & s COVID test ----07-03-2021 overnight stay Arrive at -------1130 am 07-05-2021 NPO after MN NO Solid Food.  Clear liquids from MN until---1030 am Med rec completed Medications to take morning of surgery -----Latuda Diabetic medication -----n/a Patient instructed no nail polish to be worn day of surgery Patient instructed to bring photo id and insurance card day of surgery Patient aware to have Driver (ride ) / caregiver    for 24 hours after surgery  friend will drop pt off, will arrange driver for d/c after overnight stay Patient Special Instructions -----pt given overnight stay instructions Pre-Op special Istructions -----none Patient verbalized understanding of instructions that were given at this phone interview. Patient denies shortness of breath, chest pain, fever, cough at this phone interview.

## 2021-06-27 NOTE — Progress Notes (Signed)
YOU ARE SCHEDULED FOR A COVID TEST ON   07-03-2021  . THIS TEST MUST BE DONE BEFORE SURGERY. GO TO  [facility] @ [address] [place]   PHONE NUMBER [phone].   TURN LEFT AT THE SHIPPING AND RECEIVING SIGN AND LOOK FOR SMALL BLUE POP UP TENT UNDER BUILDING OVERHANG AT BACK OF BUILDING AND REMAIN IN YOUR CAR, THIS IS A DRIVE UP TEST.  AFTER YOUR COVID TEST , PLEASE WEAR A MASK OUT IN PUBLIC AND SOCIAL DISTANCE AND WASH YOUR HANDS FREQUENTLY. PLEASE ASK ALL YOUR CLOSE HOUSEHOLD CONTACT TO WEAR MASK OUT IN PUBLIC AND SOCIAL DISTANCE AND WASH HANDS FREQUENTLY ALSO.      Your procedure is scheduled on  07-05-2021  Report to [facility] AT 1130 A. M.   Call this number if you have problems the morning of surgery  :[phone].   OUR ADDRESS IS [address].  WE ARE LOCATED IN [facility].  PLEASE BRING YOUR INSURANCE CARD AND PHOTO ID DAY OF SURGERY.  ONLY ONE PERSON ALLOWED IN FACILITY WAITING AREA.                                     REMEMBER:  DO NOT EAT FOOD, CANDY GUM OR MINTS  AFTER MIDNIGHT . YOU MAY HAVE CLEAR LIQUIDS FROM MIDNIGHT UNTIL 1030 AM . NO CLEAR LIQUIDS AFTER 1030 AM DAY OF SURGERY.   YOU MAY  BRUSH YOUR TEETH MORNING OF SURGERY AND RINSE YOUR MOUTH OUT, NO CHEWING GUM CANDY OR MINTS.    CLEAR LIQUID DIET   Foods Allowed                                                                     Foods Excluded  Coffee and tea, regular and decaf                             liquids that you cannot  Plain Jell-O any favor except red or purple                                           see through such as: Fruit ices (not with fruit pulp)                                     milk, soups, orange juice  Iced Popsicles                                    All solid food Carbonated beverages, regular and diet                                    Cranberry, grape and apple juices Sports drinks like Gatorade Lightly  seasoned clear  broth or consume(fat free) Sugar  Sample Menu Breakfast                                Lunch                                     Supper Cranberry juice                    Beef broth                            Chicken broth Jell-O                                     Grape juice                           Apple juice Coffee or tea                        Jell-O                                      Popsicle                                                Coffee or tea                        Coffee or tea  _____________________________________________________________________     TAKE THESE MEDICATIONS MORNING OF SURGERY WITH A SIP OF WATER:  LATUDA.  ONE VISITOR IS ALLOWED IN WAITING ROOM ONLY DAY OF SURGERY.  YOU MAY HAVE ANOTHER PERSON SWITCH OUT WITH THE  1  VISITOR IN THE WAITING ROOM DAY OF SURGERY AND A MASK MUST BE WORN IN THE WAITING ROOM.    2 VISITORS  MAY VISIT IN THE EXTENDED RECOVERY ROOM UNTIL 800 PM ONLY 1 VISITOR AGE 90 AND OVER MAY SPEND THE NIGHT AND MUST BE IN EXTENDED RECOVERY ROOM NO LATER THAN 800 PM .   UP TO 2 CHILDREN AGE 60 TO 15 MAY ALSO VISIT IN EXTENDED RECOVERY ROOM ONLY UNTIL 800 PM AND MUST LEAVE BY 800 PM. ALL PERSONS VISITING IN EXTENDED RECOVERY ROOM MUST WEAR A MASK.                                    DO NOT WEAR JEWERLY, MAKE UP. DO NOT WEAR LOTIONS, POWDERS, PERFUMES OR NAIL POLISH ON YOUR FINGERNAILS. TOENAIL POLISH IS OK TO WEAR. DO NOT SHAVE FOR 48 HOURS PRIOR TO DAY OF SURGERY. MEN MAY SHAVE FACE AND NECK. CONTACTS, GLASSES, OR DENTURES MAY NOT BE WORN TO SURGERY.                                    [facility] IS NOT RESPONSIBLE  FOR ANY BELONGINGS.                                                                    Marland Kitchen           [facility] - Preparing for Surgery Before surgery, you can play an important role.  Because skin is not sterile, your skin needs to be as free of germs as possible.  You can reduce the number of germs on your skin  by washing with CHG (chlorahexidine gluconate) soap before surgery.  CHG is an antiseptic cleaner which kills germs and bonds with the skin to continue killing germs even after washing. Please DO NOT use if you have an allergy to CHG or antibacterial soaps.  If your skin becomes reddened/irritated stop using the CHG and inform your nurse when you arrive at Short Stay. Do not shave (including legs and underarms) for at least 48 hours prior to the first CHG shower.  You may shave your face/neck. Please follow these instructions carefully:  1.  Shower with CHG Soap the night before surgery and the  morning of Surgery.  2.  If you choose to wash your hair, wash your hair first as usual with your  normal  shampoo.  3.  After you shampoo, rinse your hair and body thoroughly to remove the  shampoo.                            4.  Use CHG as you would any other liquid soap.  You can apply chg directly  to the skin and wash                      Gently with a scrungie or clean washcloth.  5.  Apply the CHG Soap to your body ONLY FROM THE NECK DOWN.   Do not use on face/ open                           Wound or open sores. Avoid contact with eyes, ears mouth and genitals (private parts).                       Wash face,  Genitals (private parts) with your normal soap.             6.  Wash thoroughly, paying special attention to the area where your surgery  will be performed.  7.  Thoroughly rinse your body with warm water from the neck down.  8.  DO NOT shower/wash with your normal soap after using and rinsing off  the CHG Soap.                9.  Pat yourself dry with a clean towel.            10.  Wear clean pajamas.            11.  Place clean sheets on your bed the night of your first shower and do not  sleep with pets. Day of Surgery : Do not apply any lotions/deodorants the morning of surgery.  Please  wear clean clothes to the hospital/surgery center.  IF YOU HAVE ANY SKIN IRRITATION OR PROBLEMS WITH  THE SURGICAL SOAP, PLEASE GET A BAR OF GOLD DIAL SOAP AND SHOWER THE NIGHT BEFORE YOUR SURGERY AND THE MORNING OF YOUR SURGERY. PLEASE LET THE NURSE KNOW MORNING OF YOUR SURGERY IF YOU HAD ANY PROBLEMS WITH THE SURGICAL SOAP.  FAILURE TO FOLLOW THESE INSTRUCTIONS MAY RESULT IN THE CANCELLATION OF YOUR SURGERY PATIENT SIGNATURE_________________________________  NURSE SIGNATURE__________________________________  ________________________________________________________________________                                                        QUESTIONS Annye Asa PRE OP NURSE PHONE [phone].

## 2021-07-03 ENCOUNTER — Other Ambulatory Visit: Payer: Self-pay | Admitting: Obstetrics

## 2021-07-03 ENCOUNTER — Encounter (HOSPITAL_COMMUNITY)
Admission: RE | Admit: 2021-07-03 | Discharge: 2021-07-03 | Disposition: A | Discharge status: Home / Self Care | Payer: No Typology Code available for payment source | Source: Ambulatory Visit | Attending: Obstetrics | Admitting: Obstetrics

## 2021-07-03 DIAGNOSIS — Z01812 Encounter for preprocedural laboratory examination: Secondary | ICD-10-CM | POA: Diagnosis not present

## 2021-07-03 DIAGNOSIS — Z20822 Contact with and (suspected) exposure to covid-19: Secondary | ICD-10-CM | POA: Insufficient documentation

## 2021-07-03 LAB — CBC
HCT: 40.3 % (ref 36.0–46.0)
Hemoglobin: 13.1 g/dL (ref 12.0–15.0)
MCH: 31.7 pg (ref 26.0–34.0)
MCHC: 32.5 g/dL (ref 30.0–36.0)
MCV: 97.6 fL (ref 80.0–100.0)
Platelets: 193 10*3/uL (ref 150–400)
RBC: 4.13 MIL/uL (ref 3.87–5.11)
RDW: 12.2 % (ref 11.5–15.5)
WBC: 4.4 10*3/uL (ref 4.0–10.5)
nRBC: 0 % (ref 0.0–0.2)

## 2021-07-03 LAB — SARS CORONAVIRUS 2 (TAT 6-24 HRS): SARS Coronavirus 2: NEGATIVE

## 2021-07-03 LAB — BASIC METABOLIC PANEL
Anion gap: 7 (ref 5–15)
BUN: 17 mg/dL (ref 6–20)
CO2: 27 mmol/L (ref 22–32)
Calcium: 9.5 mg/dL (ref 8.9–10.3)
Chloride: 107 mmol/L (ref 98–111)
Creatinine, Ser: 0.84 mg/dL (ref 0.44–1.00)
GFR, Estimated: >60 mL/min (ref >60)
Glucose, Bld: 86 mg/dL (ref 70–99)
Potassium: 4.4 mmol/L (ref 3.5–5.1)
Sodium: 141 mmol/L (ref 135–145)

## 2021-07-05 ENCOUNTER — Ambulatory Visit (HOSPITAL_BASED_OUTPATIENT_CLINIC_OR_DEPARTMENT_OTHER)
Admission: RE | Admit: 2021-07-05 | Discharge: 2021-07-06 | Disposition: A | Discharge status: Home / Self Care | Payer: No Typology Code available for payment source | Attending: Obstetrics | Admitting: Obstetrics

## 2021-07-05 ENCOUNTER — Ambulatory Visit (HOSPITAL_BASED_OUTPATIENT_CLINIC_OR_DEPARTMENT_OTHER): Payer: No Typology Code available for payment source | Admitting: Anesthesiology

## 2021-07-05 ENCOUNTER — Encounter (HOSPITAL_BASED_OUTPATIENT_CLINIC_OR_DEPARTMENT_OTHER): Payer: Self-pay | Admitting: Obstetrics

## 2021-07-05 ENCOUNTER — Encounter (HOSPITAL_BASED_OUTPATIENT_CLINIC_OR_DEPARTMENT_OTHER)
Admission: RE | Disposition: A | Discharge status: Home / Self Care | Payer: Self-pay | Source: Home / Self Care | Attending: Obstetrics

## 2021-07-05 DIAGNOSIS — Z90721 Acquired absence of ovaries, unilateral: Secondary | ICD-10-CM | POA: Diagnosis present

## 2021-07-05 DIAGNOSIS — N809 Endometriosis, unspecified: Secondary | ICD-10-CM | POA: Diagnosis not present

## 2021-07-05 DIAGNOSIS — Z90710 Acquired absence of both cervix and uterus: Secondary | ICD-10-CM | POA: Diagnosis present

## 2021-07-05 DIAGNOSIS — N871 Moderate cervical dysplasia: Secondary | ICD-10-CM | POA: Diagnosis present

## 2021-07-05 DIAGNOSIS — N888 Other specified noninflammatory disorders of cervix uteri: Secondary | ICD-10-CM | POA: Diagnosis not present

## 2021-07-05 HISTORY — DX: Personal history of urinary calculi: Z87.442

## 2021-07-05 HISTORY — PX: ROBOTIC ASSISTED LAPAROSCOPIC HYSTERECTOMY AND SALPINGECTOMY: SHX6379

## 2021-07-05 HISTORY — DX: Presence of spectacles and contact lenses: Z97.3

## 2021-07-05 LAB — TYPE AND SCREEN
ABO/RH(D): A POS
Antibody Screen: NEGATIVE

## 2021-07-05 LAB — ABO/RH: ABO/RH(D): A POS

## 2021-07-05 SURGERY — XI ROBOTIC ASSISTED LAPAROSCOPIC HYSTERECTOMY AND SALPINGECTOMY
Anesthesia: General | Site: Abdomen | Laterality: Bilateral

## 2021-07-05 MED ORDER — DEXAMETHASONE SODIUM PHOSPHATE 10 MG/ML IJ SOLN
INTRAMUSCULAR | Status: DC | PRN
  Administered 2021-07-05: 10 mg via INTRAVENOUS

## 2021-07-05 MED ORDER — PHENYLEPHRINE 40 MCG/ML (10ML) SYRINGE FOR IV PUSH (FOR BLOOD PRESSURE SUPPORT)
PREFILLED_SYRINGE | INTRAVENOUS | Status: AC
  Filled 2021-07-05: qty 30

## 2021-07-05 MED ORDER — TEMAZEPAM 30 MG PO CAPS
30.0000 mg | ORAL_CAPSULE | Freq: Every day | ORAL | Status: DC
  Administered 2021-07-05: 23:00:00 30 mg via ORAL
  Filled 2021-07-05: qty 1

## 2021-07-05 MED ORDER — HYDROMORPHONE HCL 1 MG/ML IJ SOLN: 0.2000 mg | INTRAMUSCULAR | Status: DC | PRN

## 2021-07-05 MED ORDER — ONDANSETRON HCL 4 MG/2ML IJ SOLN
INTRAMUSCULAR | Status: AC
  Filled 2021-07-05: qty 2

## 2021-07-05 MED ORDER — BUPIVACAINE HCL (PF) 0.25 % IJ SOLN
INTRAMUSCULAR | Status: DC | PRN
  Administered 2021-07-05: 17 mL

## 2021-07-05 MED ORDER — ACETAMINOPHEN 500 MG PO TABS
1000.0000 mg | ORAL_TABLET | Freq: Four times a day (QID) | ORAL | Status: DC
  Administered 2021-07-05 – 2021-07-06 (×3): 1000 mg via ORAL

## 2021-07-05 MED ORDER — KETOROLAC TROMETHAMINE 30 MG/ML IJ SOLN: 30.0000 mg | Freq: Once | INTRAMUSCULAR | Status: DC | PRN

## 2021-07-05 MED ORDER — SUGAMMADEX SODIUM 200 MG/2ML IV SOLN
INTRAVENOUS | Status: DC | PRN
  Administered 2021-07-05: 200 mg via INTRAVENOUS

## 2021-07-05 MED ORDER — EPHEDRINE SULFATE-NACL 50-0.9 MG/10ML-% IV SOSY
PREFILLED_SYRINGE | INTRAVENOUS | Status: DC | PRN
  Administered 2021-07-05: 10 mg via INTRAVENOUS

## 2021-07-05 MED ORDER — SCOPOLAMINE 1 MG/3DAYS TD PT72
1.0000 | MEDICATED_PATCH | TRANSDERMAL | Status: DC
  Administered 2021-07-05: 12:00:00 1.5 mg via TRANSDERMAL

## 2021-07-05 MED ORDER — MENTHOL 3 MG MT LOZG: 1.0000 | LOZENGE | OROMUCOSAL | Status: DC | PRN

## 2021-07-05 MED ORDER — AMISULPRIDE (ANTIEMETIC) 5 MG/2ML IV SOLN: 10.0000 mg | Freq: Once | INTRAVENOUS | Status: DC | PRN

## 2021-07-05 MED ORDER — DEXAMETHASONE SODIUM PHOSPHATE 10 MG/ML IJ SOLN
INTRAMUSCULAR | Status: AC
  Filled 2021-07-05: qty 1

## 2021-07-05 MED ORDER — GLYCOPYRROLATE PF 0.2 MG/ML IJ SOSY
PREFILLED_SYRINGE | INTRAMUSCULAR | Status: AC
  Filled 2021-07-05: qty 1

## 2021-07-05 MED ORDER — PROPOFOL 10 MG/ML IV BOLUS
INTRAVENOUS | Status: AC
  Filled 2021-07-05: qty 20

## 2021-07-05 MED ORDER — MIDAZOLAM HCL 5 MG/5ML IJ SOLN
INTRAMUSCULAR | Status: DC | PRN
  Administered 2021-07-05: 2 mg via INTRAVENOUS

## 2021-07-05 MED ORDER — KETOROLAC TROMETHAMINE 30 MG/ML IJ SOLN
30.0000 mg | Freq: Four times a day (QID) | INTRAMUSCULAR | Status: DC
  Administered 2021-07-05 – 2021-07-06 (×2): 30 mg via INTRAVENOUS

## 2021-07-05 MED ORDER — PROPOFOL 10 MG/ML IV BOLUS
INTRAVENOUS | Status: DC | PRN
  Administered 2021-07-05: 20 mg via INTRAVENOUS
  Administered 2021-07-05: 100 mg via INTRAVENOUS

## 2021-07-05 MED ORDER — IBUPROFEN 200 MG PO TABS: 600.0000 mg | ORAL_TABLET | Freq: Four times a day (QID) | ORAL | Status: DC

## 2021-07-05 MED ORDER — LIDOCAINE HCL (PF) 2 % IJ SOLN
INTRAMUSCULAR | Status: DC | PRN
  Administered 2021-07-05: 1.5 mg/kg/h via INTRADERMAL

## 2021-07-05 MED ORDER — ONDANSETRON HCL 4 MG PO TABS: 4.0000 mg | ORAL_TABLET | Freq: Four times a day (QID) | ORAL | Status: DC | PRN

## 2021-07-05 MED ORDER — SIMETHICONE 80 MG PO CHEW: 80.0000 mg | CHEWABLE_TABLET | Freq: Four times a day (QID) | ORAL | Status: DC | PRN

## 2021-07-05 MED ORDER — ACETAMINOPHEN 500 MG PO TABS
ORAL_TABLET | ORAL | Status: AC
  Filled 2021-07-05: qty 2

## 2021-07-05 MED ORDER — GLYCOPYRROLATE PF 0.2 MG/ML IJ SOSY
PREFILLED_SYRINGE | INTRAMUSCULAR | Status: DC | PRN
  Administered 2021-07-05: .2 mg via INTRAVENOUS

## 2021-07-05 MED ORDER — KETOROLAC TROMETHAMINE 30 MG/ML IJ SOLN
INTRAMUSCULAR | Status: AC
  Filled 2021-07-05: qty 1

## 2021-07-05 MED ORDER — OXYCODONE HCL 5 MG/5ML PO SOLN: 5.0000 mg | Freq: Once | ORAL | Status: DC | PRN

## 2021-07-05 MED ORDER — ROCURONIUM BROMIDE 10 MG/ML (PF) SYRINGE
PREFILLED_SYRINGE | INTRAVENOUS | Status: AC
  Filled 2021-07-05: qty 10

## 2021-07-05 MED ORDER — ACETAMINOPHEN 500 MG PO TABS
1000.0000 mg | ORAL_TABLET | Freq: Once | ORAL | Status: AC
  Administered 2021-07-05: 12:00:00 1000 mg via ORAL

## 2021-07-05 MED ORDER — EPHEDRINE 5 MG/ML INJ
INTRAVENOUS | Status: AC
  Filled 2021-07-05: qty 5

## 2021-07-05 MED ORDER — SCOPOLAMINE 1 MG/3DAYS TD PT72
MEDICATED_PATCH | TRANSDERMAL | Status: AC
  Filled 2021-07-05: qty 1

## 2021-07-05 MED ORDER — OXYCODONE HCL 5 MG PO TABS: 5.0000 mg | ORAL_TABLET | Freq: Once | ORAL | Status: DC | PRN

## 2021-07-05 MED ORDER — PANTOPRAZOLE SODIUM 40 MG PO TBEC
DELAYED_RELEASE_TABLET | ORAL | Status: AC
  Filled 2021-07-05: qty 1

## 2021-07-05 MED ORDER — LACTATED RINGERS IV SOLN: INTRAVENOUS | Status: DC

## 2021-07-05 MED ORDER — KETOROLAC TROMETHAMINE 30 MG/ML IJ SOLN
INTRAMUSCULAR | Status: DC | PRN
  Administered 2021-07-05: 30 mg via INTRAVENOUS

## 2021-07-05 MED ORDER — CEFAZOLIN SODIUM-DEXTROSE 2-4 GM/100ML-% IV SOLN
2.0000 g | INTRAVENOUS | Status: AC
  Administered 2021-07-05: 13:00:00 2 g via INTRAVENOUS

## 2021-07-05 MED ORDER — FENTANYL CITRATE (PF) 100 MCG/2ML IJ SOLN
INTRAMUSCULAR | Status: AC
  Filled 2021-07-05: qty 2

## 2021-07-05 MED ORDER — ROCURONIUM BROMIDE 10 MG/ML (PF) SYRINGE
PREFILLED_SYRINGE | INTRAVENOUS | Status: DC | PRN
  Administered 2021-07-05: 60 mg via INTRAVENOUS
  Administered 2021-07-05 (×2): 20 mg via INTRAVENOUS

## 2021-07-05 MED ORDER — SODIUM CHLORIDE 0.9 % IV SOLN
INTRAVENOUS | Status: DC | PRN
  Administered 2021-07-05: 16:00:00 60 mL

## 2021-07-05 MED ORDER — CEFAZOLIN SODIUM-DEXTROSE 2-4 GM/100ML-% IV SOLN
INTRAVENOUS | Status: AC
  Filled 2021-07-05: qty 100

## 2021-07-05 MED ORDER — FENTANYL CITRATE (PF) 100 MCG/2ML IJ SOLN
25.0000 ug | INTRAMUSCULAR | Status: DC | PRN
  Administered 2021-07-05 (×2): 50 ug via INTRAVENOUS
  Administered 2021-07-05: 17:00:00 25 ug via INTRAVENOUS

## 2021-07-05 MED ORDER — PANTOPRAZOLE SODIUM 40 MG PO TBEC
40.0000 mg | DELAYED_RELEASE_TABLET | Freq: Every day | ORAL | Status: DC
  Administered 2021-07-05: 18:00:00 40 mg via ORAL

## 2021-07-05 MED ORDER — MIDAZOLAM HCL 2 MG/2ML IJ SOLN
INTRAMUSCULAR | Status: AC
  Filled 2021-07-05: qty 2

## 2021-07-05 MED ORDER — OXYCODONE HCL 5 MG PO TABS
5.0000 mg | ORAL_TABLET | ORAL | Status: DC | PRN
  Administered 2021-07-05 – 2021-07-06 (×4): 10 mg via ORAL

## 2021-07-05 MED ORDER — LIDOCAINE 2% (20 MG/ML) 5 ML SYRINGE
INTRAMUSCULAR | Status: DC | PRN
  Administered 2021-07-05: 60 mg via INTRAVENOUS

## 2021-07-05 MED ORDER — ONDANSETRON HCL 4 MG/2ML IJ SOLN
INTRAMUSCULAR | Status: DC | PRN
  Administered 2021-07-05: 4 mg via INTRAVENOUS

## 2021-07-05 MED ORDER — LIDOCAINE 2% (20 MG/ML) 5 ML SYRINGE
INTRAMUSCULAR | Status: AC
  Filled 2021-07-05: qty 5

## 2021-07-05 MED ORDER — FENTANYL CITRATE (PF) 250 MCG/5ML IJ SOLN
INTRAMUSCULAR | Status: AC
  Filled 2021-07-05: qty 5

## 2021-07-05 MED ORDER — PROMETHAZINE HCL 25 MG/ML IJ SOLN: 6.2500 mg | INTRAMUSCULAR | Status: DC | PRN

## 2021-07-05 MED ORDER — ONDANSETRON HCL 4 MG/2ML IJ SOLN: 4.0000 mg | Freq: Four times a day (QID) | INTRAMUSCULAR | Status: DC | PRN

## 2021-07-05 MED ORDER — POVIDONE-IODINE 10 % EX SWAB: 2.0000 "application " | Freq: Once | CUTANEOUS | Status: DC

## 2021-07-05 MED ORDER — OXYCODONE HCL 5 MG PO TABS
ORAL_TABLET | ORAL | Status: AC
  Filled 2021-07-05: qty 2

## 2021-07-05 MED ORDER — FENTANYL CITRATE (PF) 100 MCG/2ML IJ SOLN
INTRAMUSCULAR | Status: DC | PRN
  Administered 2021-07-05 (×2): 50 ug via INTRAVENOUS
  Administered 2021-07-05: 100 ug via INTRAVENOUS
  Administered 2021-07-05 (×2): 50 ug via INTRAVENOUS

## 2021-07-05 SURGICAL SUPPLY — 65 items
ADH SKN CLS APL DERMABOND .7 (GAUZE/BANDAGES/DRESSINGS) ×1
APL PRP STRL LF DISP 70% ISPRP (MISCELLANEOUS) ×1
BARRIER ADHS 3X4 INTERCEED (GAUZE/BANDAGES/DRESSINGS) IMPLANT
BRR ADH 4X3 ABS CNTRL BYND (GAUZE/BANDAGES/DRESSINGS)
CHLORAPREP W/TINT 26 (MISCELLANEOUS) ×2 IMPLANT
CNTNR URN SCR LID CUP LEK RST (MISCELLANEOUS) ×1 IMPLANT
CONT SPEC 4OZ STRL OR WHT (MISCELLANEOUS) ×2
COVER BACK TABLE 60X90IN (DRAPES) ×2 IMPLANT
COVER TIP SHEARS 8 DVNC (MISCELLANEOUS) ×1 IMPLANT
COVER TIP SHEARS 8MM DA VINCI (MISCELLANEOUS) ×2
DECANTER SPIKE VIAL GLASS SM (MISCELLANEOUS) ×4 IMPLANT
DEFOGGER SCOPE WARMER CLEARIFY (MISCELLANEOUS) ×2 IMPLANT
DERMABOND ADVANCED (GAUZE/BANDAGES/DRESSINGS) ×1
DERMABOND ADVANCED .7 DNX12 (GAUZE/BANDAGES/DRESSINGS) ×1 IMPLANT
DRAPE ARM DVNC X/XI (DISPOSABLE) ×4 IMPLANT
DRAPE COLUMN DVNC XI (DISPOSABLE) ×1 IMPLANT
DRAPE DA VINCI XI ARM (DISPOSABLE) ×8
DRAPE DA VINCI XI COLUMN (DISPOSABLE) ×2
DRAPE UTILITY XL STRL (DRAPES) ×2 IMPLANT
ELECT REM PT RETURN 9FT ADLT (ELECTROSURGICAL) ×2
ELECTRODE REM PT RTRN 9FT ADLT (ELECTROSURGICAL) ×1 IMPLANT
GAUZE 4X4 16PLY ~~LOC~~+RFID DBL (SPONGE) ×4 IMPLANT
GAUZE VASELINE 3X9 (GAUZE/BANDAGES/DRESSINGS) ×1 IMPLANT
GLOVE SURG ENC MOIS LTX SZ6.5 (GLOVE) ×6 IMPLANT
GLOVE SURG UNDER POLY LF SZ7 (GLOVE) ×10 IMPLANT
GOWN STRL REUS W/TWL LRG LVL3 (GOWN DISPOSABLE) ×2 IMPLANT
HOLDER FOLEY CATH W/STRAP (MISCELLANEOUS) IMPLANT
IRRIG SUCT STRYKERFLOW 2 WTIP (MISCELLANEOUS) ×2
IRRIGATION SUCT STRKRFLW 2 WTP (MISCELLANEOUS) ×1 IMPLANT
IV NS 1000ML (IV SOLUTION) ×2
IV NS 1000ML BAXH (IV SOLUTION) ×1 IMPLANT
KIT TURNOVER CYSTO (KITS) ×2 IMPLANT
LEGGING LITHOTOMY PAIR STRL (DRAPES) ×2 IMPLANT
OBTURATOR OPTICAL STANDARD 8MM (TROCAR) ×2
OBTURATOR OPTICAL STND 8 DVNC (TROCAR) ×1
OBTURATOR OPTICALSTD 8 DVNC (TROCAR) ×1 IMPLANT
OCCLUDER COLPOPNEUMO (BALLOONS) ×2 IMPLANT
PACK ROBOT WH (CUSTOM PROCEDURE TRAY) ×2 IMPLANT
PACK ROBOTIC GOWN (GOWN DISPOSABLE) ×2 IMPLANT
PACK TRENDGUARD 450 HYBRID PRO (MISCELLANEOUS) IMPLANT
PAD OB MATERNITY 4.3X12.25 (PERSONAL CARE ITEMS) ×2 IMPLANT
PAD PREP 24X48 CUFFED NSTRL (MISCELLANEOUS) ×2 IMPLANT
PROTECTOR NERVE ULNAR (MISCELLANEOUS) ×4 IMPLANT
SEAL CANN UNIV 5-8 DVNC XI (MISCELLANEOUS) ×4 IMPLANT
SEAL XI 5MM-8MM UNIVERSAL (MISCELLANEOUS) ×8
SEALER VESSEL DA VINCI XI (MISCELLANEOUS) ×2
SEALER VESSEL EXT DVNC XI (MISCELLANEOUS) ×1 IMPLANT
SET TRI-LUMEN FLTR TB AIRSEAL (TUBING) ×2 IMPLANT
SPONGE T-LAP 4X18 ~~LOC~~+RFID (SPONGE) ×2 IMPLANT
SUT VIC AB 0 CT1 27 (SUTURE) ×4
SUT VIC AB 0 CT1 27XBRD ANBCTR (SUTURE) ×2 IMPLANT
SUT VIC AB 4-0 PS2 27 (SUTURE) ×4 IMPLANT
SUT VICRYL 0 UR6 27IN ABS (SUTURE) ×4 IMPLANT
SUT VLOC 180 0 9IN  GS21 (SUTURE) ×4
SUT VLOC 180 0 9IN GS21 (SUTURE) ×1 IMPLANT
SYSTEM CARTER THOMASON II (TROCAR) IMPLANT
TIP RUMI ORANGE 6.7MMX12CM (TIP) IMPLANT
TIP UTERINE 5.1X6CM LAV DISP (MISCELLANEOUS) IMPLANT
TIP UTERINE 6.7X10CM GRN DISP (MISCELLANEOUS) IMPLANT
TIP UTERINE 6.7X6CM WHT DISP (MISCELLANEOUS) IMPLANT
TIP UTERINE 6.7X8CM BLUE DISP (MISCELLANEOUS) ×1 IMPLANT
TOWEL OR 17X26 10 PK STRL BLUE (TOWEL DISPOSABLE) ×2 IMPLANT
TRAY FOLEY W/BAG SLVR 14FR LF (SET/KITS/TRAYS/PACK) ×2 IMPLANT
TRENDGUARD 450 HYBRID PRO PACK (MISCELLANEOUS)
TROCAR PORT AIRSEAL 5X120 (TROCAR) ×2 IMPLANT

## 2021-07-05 NOTE — Brief Op Note (Signed)
07/05/2021  3:53 PM  PATIENT:  Brooke Phillips  60 y.o. female  PRE-OPERATIVE DIAGNOSIS:  Recurrent Cervical Intraepithelial Neoplasia Grade 2, Human Papilloma Virus 18  POST-OPERATIVE DIAGNOSIS:  Recurrent Cervical Intraepithelial Neoplasia Grade 2, Human Papilloma Virus, Endometriosis  PROCEDURE:  Procedure(s) with comments: XI ROBOTIC ASSISTED LAPAROSCOPIC HYSTERECTOMY AND SALPINGO OOPHERECTOMY (Bilateral) - Requests 2 1/2 hrs.  SURGEON:  Surgeon(s) and Role:    * Noland Fordyce, MD - Primary    * Law, Cassandra A, DO - Assisting  PHYSICIAN ASSISTANT:   ASSISTANTS: As above  ANESTHESIA:   general  EBL:  25 mL   BLOOD ADMINISTERED:none  DRAINS: Urinary Catheter (Foley)   LOCAL MEDICATIONS USED:  MARCAINE    and BUPIVICAINE   SPECIMEN: Uterus, cervix, bilateral ovaries and fallopian tubes  DISPOSITION OF SPECIMEN:  PATHOLOGY  COUNTS:  YES  TOURNIQUET:  * No tourniquets in log *  DICTATION: .Note written in EPIC  PLAN OF CARE: Admit for overnight observation  PATIENT DISPOSITION:  PACU - hemodynamically stable.   Delay start of Pharmacological VTE agent (>24hrs) due to surgical blood loss or risk of bleeding: yes

## 2021-07-05 NOTE — Op Note (Signed)
07/05/2021  3:53 PM  PATIENT:  Brooke Phillips  60 y.o. female  PRE-OPERATIVE DIAGNOSIS:  Recurrent Cervical Intraepithelial Neoplasia Grade 2, Human Papilloma Virus 18  POST-OPERATIVE DIAGNOSIS:  Recurrent Cervical Intraepithelial Neoplasia Grade 2, Human Papilloma Virus, Endometriosis  PROCEDURE:  Procedure(s) with comments: XI ROBOTIC ASSISTED LAPAROSCOPIC HYSTERECTOMY AND SALPINGO OOPHERECTOMY (Bilateral) - Requests 2 1/2 hrs.  SURGEON:  Surgeon(s) and Role:    * Noland Fordyce, MD - Primary    * Law, Cassandra A, DO - Assisting  PHYSICIAN ASSISTANT:   ASSISTANTS: As above  ANESTHESIA:   general  EBL:  25 mL   BLOOD ADMINISTERED:none  DRAINS: Urinary Catheter (Foley)   LOCAL MEDICATIONS USED:  MARCAINE    and BUPIVICAINE   SPECIMEN: Uterus, cervix, bilateral ovaries and fallopian tubes  DISPOSITION OF SPECIMEN:  PATHOLOGY  COUNTS:  YES  TOURNIQUET:  * No tourniquets in log *  DICTATION: .Note written in EPIC  PLAN OF CARE: Admit for overnight observation  PATIENT DISPOSITION:  PACU - hemodynamically stable.   Delay start of Pharmacological VTE agent (>24hrs) due to surgical blood loss or risk of bleeding: yes   Procedure:Robotic-assisted total laparoscopic hysterectomy with BSO Complications: none Antibiotics: 2 g Ancef Findings: nl b/l ovaries and tubes with spot of endometriosis on right tube, spot of endometriosis on the left uterosacral ligament,, normal uterus, Hemostasis post-procedure with peristalsis of bilateral ureters.   Indications: This is a 60 year old postmenopausal patient here for robotic assisted total laparoscopic hysterectomy with bilateral salpingo-oophorectomy for HPV 18 and recurrent CIN-2.  Intra-Op findings of endometriosis.  Procedure: After informed consent obtained, the patient was taken to the operating room where general anesthesia was initiated without difficulty.   She was prepped and draped in normal sterile fashion in the  dorsal supine lithotomy position. A Foley catheter was inserted sterilely into the bladder. A bimanual examination was done to assess the size and position of the uterus. A weighted speculum was placed in the vagina and deaver retractors were used on the anterior vaginal wall. .The cervix was grasped with tenaculum. The cervix was sounded to 8 cm. The cervix was assessed to identify the Rumi-Co size.  A medium cup and an 8 cm shaft was used. The uterine balloon was inflated.  Gloved were changed. Attention was then turned to the patient's abdomen. 0.5 % marcaine was used prior to all incision. A total of 10 cc of marcaine was used.  A 10 mm incision was made in the umbilicus and blunt and sharp dissection was done until the fascia was identified. This was then grasped with Kocher clamps x2 and entered sharply. A pursestring suture of 0 Vicryl was then placed along the lateral edge of the incision and a non-bladed Roseanne Reno was inserted into the peritoneal cavity. Intraperitoneal placement was confirmed with the use of the camera and pneumoperitoneum was created to 15 mm of mercury.  Of note the trocar was positioned through an adhesion.  This was evaluated multiple times through the case.  There was some omentum in the adhesion but no bowel.  It was hemostatic at the end of the case.    Brief survey of the abdomen and pelvis was done with findings as above. The abdominal wall was assessed and additional port sites were marked. 8 mm incisions were placed in the right (two) and left lower quadrants (2) and non-bladed ports were placed under direct visualization. The robot was brought to the patient's side and attached with the right side docking.  The robotic instruments were placed under direct visualization until proper placement just over the uterus.  I then went to the robotic console. Brief survey of the patient's abdomen and pelvis revealed  findings as above.Bilateral tubes and ovaries were normal. No  significant adhesions were noted. Pelvic washings were taken.  I began on the left side: the  Left ureter was not well seen but its likely position was mapped based on the location of the right sluggishly peristalsing ureter.  Left  IP were identified. The IP was serially cauterized and transected with the vessel sealer.  Staying type II the ovary and the mesosalpinx was cauterized and transected just under the tube and ovary the left round ligament was cauterized and divided.  The anterior leaf of the broad ligament was dissected bluntly then monopolar cautery was used to separate the anterior and posterior leafs of the broad ligament. This was carried down through to the bladder flap.   Attention was then turned to the patient's  Right side: the right IP was serially cauterized and transected.  The mesosalpinx was serially cauterized and transected just under the tube and ovary.  The right round  ligament was cauterized and transected. The anterior and posterior leaves of the broad ligament were bluntly and sharply dissected apart from each other. Monopolar cautery was used to come across the anterior leaf of the right broad ligament. This dissection was carried down across to the midline to create the bladder flap.   The leftt uterine artery was serially cauterized with the vessel sealer and transected.  The right uterine artery and then serially cauterized with the vessel sealer and transected. The uterus was placed on stretch to allow better visualization of the arteries. The bladder flap was further taken down both sharply and with cautery. Cautery was used for hemostasis on several places along the bladder flap. At this point with the pressure inward on the Rumi, the anterior colpotomy was made with the monopolar scissors. This was carried around to the patient's right side. Additional bipolar cautery was used on the  both angles of the cuff- this was carried out with the vessel sealer. The uterus was  then positioned  anteriorly  to allow easy access to the posterior  colpotomy. This was carried out with the monopolar scissors.  Good hemostasis was noted along the angles of the incision.  With manipulation of the specimen, the uterus, cervix,  bilateral tubes and ovaries were pulled out through the vagina.   Irrigation was used and the vaginal cuff appeared hemostatic. A 9 inch V-lock suture was placed though the umbilical port. Instruments were changed to allow a suture cut needle driver through the #1 port and and a long-tipped forceps through the #2 port. The right angle was closed and locked with the V-lock suture. Running suture was carried out toward the L angle.  About three quarters of the way to the left angle of the V-Loc suture was accidentally transected.  The needle was removed.  A second V-Loc suture was used to carry across the remaining 25% of the vaginal cuff to the left angle.  It was then carried back towards the transected edge and need to ends were tied together.  A more superficial layer of suture was placed travelling back to the right angle. The suture was cut and removed. Excellent hemostasis was noted. Suction irrigation was carried out and hemostasis was assured along the vaginal cuff. The pedicles were reassessed also found to be hemostatic. All  free fluid was removed from the abdomen. The robotic portion was completed. The robot was undocked.   By standard laparoscopy the pelvis was irrigated and evaluated. The patient was placed in reverse Trendelenburg, all additional fluid was suctioned from the abdomen and pelvis the cuff was reinspected and  found to be hemostatic. All pedicles were also found to be hemostatic.  Placing the camera through the left sided port and the umbilical port was reassessed and the trocar was removed.  Pneumoperitoneum was released and the fascial incision of the umbilical port was closed.  Good hemostasis was noted and no bowel injury was identified.   Again this port did track through the omental adhesion.  Pneumoperitoneum was recreated and again evaluation the pelvis was done.  Hemostasis was noted throughout.  60 CC a solution of 30 cc 1/4% bupivicaine with 30 cc of saline was placed into the peritoneal cavity.  Under direct visualization the ports were removed. Pneumoperitoneum was released . No additional fascial incisions were closed due to the small size and the nondilated nature of these incisions. A 4-0 Vicryl was used to close the additional laparoscopic ports sites. Good hemostasis was noted.  Sponge lap and needle counts were correct x3 the patient was woken from general anesthesia having tolerated the procedure well and taken to the recovery room in a stable fashion.

## 2021-07-05 NOTE — H&P (Signed)
Chief complaint: Recurrent cervical dysplasia  History of present illness: 60 year old G3 P2-0-1-2 here for total laparoscopic hysterectomy and bilateral salpingo-oophorectomy for recurrent cervical dysplasia.  Patient with long history of abnormal Pap smears dating to about 2011.  In 2014 she had Pap showing ASCUS cannot rule out high-grade with AGUS but colpo with CIN-1 and HPV the fact within normal endometrial biopsy in 2015 patient then had a hindbrain path with high risk HPV she had a LEEP done after that which showed CIN-2 at the endocervical margin.  Patient remained HPV positive in 2016 and 2018.  In 2020 she progressed to low-grade dysplasia still with HPV and in August 22 patient had low-grade Pap HPV 18/45 positive and CIN 2 on a colposcopic biopsy.  Given her persistent high risk HPV and abnormal Pap smears patient has been encouraged to proceed with definitive hysterectomy for several years.  She is finally agreeable to definitive management.  Recent ultrasound November 22: Uterus 7 x 4 x 3 cm with a 55 cc volume and a 2.6 mm endometrial stripe left ovary has a complex 1 cm cyst with a normal right ovary.  Patient does have a prior abdominoplasty, history of interstitial cystitis, and is status post tubal ligation.  Family history: No history of breast ovarian or colon cancer Social history.  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   Past Medical History:  Diagnosis Date   Depression with anxiety 2008   started when husband dx w/  colon ca   Gastric ulcer    yrs ago per pt on 06-27-2021   History of kidney stones    Wears glasses     Past Surgical History:  Procedure Laterality Date   basal cell carcinoma removed from face  2019   colonscopy  2012   CYSTOSCOPY WITH URETEROSCOPY AND STENT PLACEMENT Right 01/08/2019   Procedure: CYSTOSCOPY WITH URETEROSCOPY LASER LITHO AND STENT PLACEMENT;  Surgeon: Crist Fat, MD;  Location:  [facility];  Service: Urology;  Laterality: Right;   HERNIA REPAIR     inguinal as child   TUBAL LIGATION     33 yrs ago per pt on 06-27-2021     Medications Lexapro 20 mg, folic acid 1 mg, estradiol patch 0.5 mg twice weekly, progesterone 100 mg every night, Xanax as needed, Latuda at bedtime, temazepam at bedtime  No known drug allergies  PE Vitals:   06/27/21 1549 07/05/21 1153  BP:  127/90  Pulse:  (!) 55  Resp:  16  Temp:  98.2 F (36.8 C)  TempSrc:  Oral  SpO2:  100%  Weight: 74.8 kg 76.3 kg  Height: 5\' 8"  (1.727 m) 5\' 7"  (1.702 m)   Gen: well appearing, no distress Skin: warm, dry Abd: soft, NT, ND GU: def to OR LE: NT, no edema  CBC    Component Value Date/Time   WBC 4.4 07/03/2021 1416   RBC 4.13 07/03/2021 1416   HGB 13.1 07/03/2021 1416   HCT 40.3 07/03/2021 1416   PLT 193 07/03/2021 1416   MCV 97.6 07/03/2021 1416   MCH 31.7 07/03/2021 1416   MCHC 32.5 07/03/2021 1416   RDW 12.2 07/03/2021 1416   LYMPHSABS 1.4 04/08/2011 1127   MONOABS 0.5 04/08/2011 1127   EOSABS 0.0 04/08/2011 1127   BASOSABS 0.0 04/08/2011 1127     Assessment and plan: 60 year old G3, P2 with recurrent cervical dysplasia here for definitive hysterectomy with bilateral salpingoectomy given her menopausal  status.  Patient understands risk benefits surgery and agrees to proceed.  We will continue her ERT postoperatively will no longer progesterone and back.  Continue home meds while in house.  Lendon Colonel 07/05/2021 12:13 PM

## 2021-07-05 NOTE — Anesthesia Preprocedure Evaluation (Addendum)
Anesthesia Evaluation  Patient identified by MRN, date of birth, ID band Patient awake    Reviewed: Allergy & Precautions, NPO status , Patient's Chart, lab work & pertinent test results  Airway Mallampati: II  TM Distance: >3 FB Neck ROM: Full    Dental no notable dental hx.    Pulmonary former smoker,    Pulmonary exam normal breath sounds clear to auscultation       Cardiovascular negative cardio ROS Normal cardiovascular exam Rhythm:Regular Rate:Normal     Neuro/Psych PSYCHIATRIC DISORDERS Anxiety Depression negative neurological ROS     GI/Hepatic Neg liver ROS, PUD,   Endo/Other  negative endocrine ROS  Renal/GU negative Renal ROS     Musculoskeletal negative musculoskeletal ROS (+)   Abdominal   Peds  Hematology negative hematology ROS (+)   Anesthesia Other Findings Recurrent Cervical Intraepithelial Neoplasia Grade 2 Human Papilloma Virus  Reproductive/Obstetrics                            Anesthesia Physical Anesthesia Plan  ASA: 2  Anesthesia Plan: General   Post-op Pain Management:    Induction: Intravenous  PONV Risk Score and Plan: 4 or greater and Ondansetron, Dexamethasone, Midazolam, Scopolamine patch - Pre-op and Treatment may vary due to age or medical condition  Airway Management Planned: Oral ETT  Additional Equipment:   Intra-op Plan:   Post-operative Plan: Extubation in OR  Informed Consent: I have reviewed the patients History and Physical, chart, labs and discussed the procedure including the risks, benefits and alternatives for the proposed anesthesia with the patient or authorized representative who has indicated his/her understanding and acceptance.     Dental advisory given  Plan Discussed with: CRNA  Anesthesia Plan Comments:        Anesthesia Quick Evaluation

## 2021-07-05 NOTE — Anesthesia Postprocedure Evaluation (Signed)
Anesthesia Post Note  Patient: Brooke Phillips  Procedure(s) Performed: XI ROBOTIC ASSISTED LAPAROSCOPIC HYSTERECTOMY AND SALPINGO OOPHERECTOMY (Bilateral: Abdomen)     Patient location during evaluation: PACU Anesthesia Type: General Level of consciousness: awake and alert Pain management: pain level controlled Vital Signs Assessment: post-procedure vital signs reviewed and stable Respiratory status: spontaneous breathing, nonlabored ventilation and respiratory function stable Cardiovascular status: blood pressure returned to baseline and stable Postop Assessment: no apparent nausea or vomiting Anesthetic complications: no   No notable events documented.  Last Vitals:  Vitals:   07/05/21 1615 07/05/21 1630  BP: 122/79 121/70  Pulse: 73 (!) 59  Resp: 11 10  Temp: 36.6 C   SpO2: 98% 90%    Last Pain:  Vitals:   07/05/21 1630  TempSrc:   PainSc: 4                  Cecile Hearing

## 2021-07-05 NOTE — OR Nursing (Signed)
IN PREOP INTERVIEW PT STATED SHE WAS HAVING A HYSTERECTOMY , WHEN QUESTIONED IF SHE WAS ALSO HAVING HER TUBES OR OVARIES REMOVED SHE STATED "I AM FINE WITH HER TAKING ALL OF IT, SHE SAID SHE MIGHT"

## 2021-07-05 NOTE — Transfer of Care (Signed)
Immediate Anesthesia Transfer of Care Note  Patient: Brooke Phillips  Procedure(s) Performed: XI ROBOTIC ASSISTED LAPAROSCOPIC HYSTERECTOMY AND SALPINGO OOPHERECTOMY (Bilateral: Abdomen)  Patient Location: PACU  Anesthesia Type:General  Level of Consciousness: awake, alert , oriented and patient cooperative  Airway & Oxygen Therapy: Patient Spontanous Breathing  Post-op Assessment: Report given to RN and Post -op Vital signs reviewed and stable  Post vital signs: Reviewed and stable  Last Vitals:  Vitals Value Taken Time  BP 125/77 07/05/21 1604  Temp    Pulse 75 07/05/21 1607  Resp 8 07/05/21 1607  SpO2 96 % 07/05/21 1607  Vitals shown include unvalidated device data.  Last Pain:  Vitals:   07/05/21 1153  TempSrc: Oral  PainSc: 0-No pain      Patients Stated Pain Goal: 4 (07/05/21 1153)  Complications: No notable events documented.

## 2021-07-05 NOTE — Anesthesia Procedure Notes (Signed)
Procedure Name: Intubation Date/Time: 07/05/2021 1:12 PM Performed by: Rogers Blocker, CRNA Pre-anesthesia Checklist: Patient identified, Emergency Drugs available, Suction available and Patient being monitored Patient Re-evaluated:Patient Re-evaluated prior to induction Oxygen Delivery Method: Circle System Utilized Preoxygenation: Pre-oxygenation with 100% oxygen Induction Type: IV induction Ventilation: Mask ventilation without difficulty Laryngoscope Size: Mac and 3 Grade View: Grade I Tube type: Oral Tube size: 7.0 mm Number of attempts: 1 Airway Equipment and Method: Stylet and Bite block Placement Confirmation: ETT inserted through vocal cords under direct vision, positive ETCO2 and breath sounds checked- equal and bilateral Secured at: 22 cm Tube secured with: Tape Dental Injury: Teeth and Oropharynx as per pre-operative assessment

## 2021-07-06 ENCOUNTER — Encounter (HOSPITAL_BASED_OUTPATIENT_CLINIC_OR_DEPARTMENT_OTHER): Payer: Self-pay | Admitting: Obstetrics

## 2021-07-06 DIAGNOSIS — N871 Moderate cervical dysplasia: Secondary | ICD-10-CM | POA: Diagnosis not present

## 2021-07-06 LAB — CBC
HCT: 37.1 % (ref 36.0–46.0)
Hemoglobin: 12.1 g/dL (ref 12.0–15.0)
MCH: 32.1 pg (ref 26.0–34.0)
MCHC: 32.6 g/dL (ref 30.0–36.0)
MCV: 98.4 fL (ref 80.0–100.0)
Platelets: 167 10*3/uL (ref 150–400)
RBC: 3.77 MIL/uL — ABNORMAL LOW (ref 3.87–5.11)
RDW: 11.9 % (ref 11.5–15.5)
WBC: 8.6 10*3/uL (ref 4.0–10.5)
nRBC: 0 % (ref 0.0–0.2)

## 2021-07-06 MED ORDER — OXYCODONE HCL 5 MG PO TABS
ORAL_TABLET | ORAL | Status: AC
  Filled 2021-07-06: qty 2

## 2021-07-06 MED ORDER — KETOROLAC TROMETHAMINE 30 MG/ML IJ SOLN
INTRAMUSCULAR | Status: AC
  Filled 2021-07-06: qty 1

## 2021-07-06 MED ORDER — ACETAMINOPHEN 500 MG PO TABS
ORAL_TABLET | ORAL | Status: AC
  Filled 2021-07-06: qty 2

## 2021-07-06 MED ORDER — ACETAMINOPHEN 500 MG PO TABS: 1000.0000 mg | ORAL_TABLET | Freq: Four times a day (QID) | ORAL | 1 refills | Status: AC

## 2021-07-06 MED ORDER — IBUPROFEN 600 MG PO TABS
600.0000 mg | ORAL_TABLET | Freq: Four times a day (QID) | ORAL | 1 refills | Status: AC
Start: 2021-07-06 — End: ?

## 2021-07-06 MED ORDER — OXYCODONE HCL 5 MG PO TABS
5.0000 mg | ORAL_TABLET | ORAL | 0 refills | Status: AC | PRN
Start: 2021-07-06 — End: ?

## 2021-07-06 NOTE — Progress Notes (Signed)
Call returned from Dr. Algie Coffer. Continue to observe until voids 300cc.

## 2021-07-06 NOTE — Discharge Summary (Signed)
Brooke Phillips MRN: [id number] DOB/AGE: 59-Aug-1962 59 y.o.  Admit date: 07/05/2021 Discharge date: 07/06/21  Admission Diagnoses: Cervical intraepithelial neoplasia grade 2 [N87.1] S/P hysterectomy with oophorectomy [Z90.710, Z90.721]  Discharge Diagnoses: Cervical intraepithelial neoplasia grade 2 [N87.1] S/P hysterectomy with oophorectomy [Z90.710, Z90.721]        Principal Problem:   Cervical intraepithelial neoplasia grade 2 Active Problems:   S/P hysterectomy with oophorectomy   Discharged Condition: good  Hospital Course: Pt admitted for planned procedure. Done without complication. Overnight pt has minimal bleeding, no fevers, pain controlled with po meds, + flatus, + ambulation, folerating po.   On POD#1 Vitals:   07/06/21 0215 07/06/21 0512  BP: (!) 95/52 (!) 107/59  Pulse: 65 62  Resp: 16 18  Temp: 98.5 F (36.9 C) 98.3 F (36.8 C)  SpO2: 94% 97%   Gen: well appearing, no distress Abd: soft distension, appropriately tender Inc: bruised, intact, no evidence hernia GU: no staining LE: NT, no edema  CBC Latest Ref Rng & Units 07/06/2021 07/03/2021 01/08/2019  WBC 4.0 - 10.5 K/uL 8.6 4.4 -  Hemoglobin 12.0 - 15.0 g/dL 56.3 14.9 70.2  Hematocrit 36.0 - 46.0 % 37.1 40.3 36.0  Platelets 150 - 400 K/uL 167 193 -     Consults: None  Treatments: surgery: TLH/ BSO  Disposition:  Home     Allergies as of 07/06/2021   No Known Allergies      Medication List     TAKE these medications    acetaminophen 500 MG tablet Commonly known as: TYLENOL Take 2 tablets (1,000 mg total) by mouth every 6 (six) hours.   acyclovir 800 MG tablet Commonly known as: ZOVIRAX TAKE 1 TABLET BY MOUTH 3 TIMES A DAY AS NEEDED FOR FEVER BLISTERS FOR 5 DAYS PER EPISODE   [facility] DAILY GUMMIES PO Take 1 tablet by mouth daily.   ibuprofen 600 MG tablet Commonly known as: ADVIL Take 1 tablet (600 mg total) by mouth every 6 (six) hours.   lurasidone 40 MG Tabs tablet Commonly  known as: LATUDA Take 40 mg by mouth daily with breakfast.   oxyCODONE 5 MG immediate release tablet Commonly known as: Oxy IR/ROXICODONE Take 1-2 tablets (5-10 mg total) by mouth every 4 (four) hours as needed for moderate pain.   temazepam 30 MG capsule Commonly known as: RESTORIL Take 30 mg by mouth at bedtime.         Signed: Lendon Colonel, MD 07/06/2021, 8:13 AM

## 2021-07-14 LAB — SURGICAL PATHOLOGY
